# Patient Record
Sex: Male | Born: 1950
Health system: Southern US, Community
[De-identification: ages and names within clinical notes are randomized; demographics above are authoritative.]

## PROBLEM LIST (undated history)

## (undated) DIAGNOSIS — C801 Malignant (primary) neoplasm, unspecified: Secondary | ICD-10-CM

## (undated) DIAGNOSIS — N189 Chronic kidney disease, unspecified: Secondary | ICD-10-CM

## (undated) DIAGNOSIS — T7840XA Allergy, unspecified, initial encounter: Secondary | ICD-10-CM

## (undated) DIAGNOSIS — E785 Hyperlipidemia, unspecified: Secondary | ICD-10-CM

## (undated) DIAGNOSIS — E119 Type 2 diabetes mellitus without complications: Secondary | ICD-10-CM

## (undated) DIAGNOSIS — H269 Unspecified cataract: Secondary | ICD-10-CM

## (undated) DIAGNOSIS — I10 Essential (primary) hypertension: Secondary | ICD-10-CM

## (undated) DIAGNOSIS — K219 Gastro-esophageal reflux disease without esophagitis: Secondary | ICD-10-CM

## (undated) HISTORY — PX: ANAL FISSURE REPAIR: SHX2312

## (undated) HISTORY — PX: POLYPECTOMY: SHX149

## (undated) HISTORY — DX: Type 2 diabetes mellitus without complications: E11.9

## (undated) HISTORY — PX: COLONOSCOPY: SHX174

## (undated) HISTORY — DX: Allergy, unspecified, initial encounter: T78.40XA

## (undated) HISTORY — PX: CYSTOSCOPY WITH INSERTION OF UROLIFT: SHX6678

## (undated) HISTORY — DX: Gastro-esophageal reflux disease without esophagitis: K21.9

## (undated) HISTORY — DX: Chronic kidney disease, unspecified: N18.9

## (undated) HISTORY — DX: Unspecified cataract: H26.9

---

## 2005-07-05 ENCOUNTER — Ambulatory Visit: Payer: Self-pay | Admitting: Internal Medicine

## 2005-07-31 ENCOUNTER — Ambulatory Visit: Payer: Self-pay | Admitting: Internal Medicine

## 2005-08-28 ENCOUNTER — Ambulatory Visit: Payer: Self-pay | Admitting: Internal Medicine

## 2008-09-24 DIAGNOSIS — C801 Malignant (primary) neoplasm, unspecified: Secondary | ICD-10-CM

## 2008-09-24 HISTORY — DX: Malignant (primary) neoplasm, unspecified: C80.1

## 2008-09-24 HISTORY — PX: NEPHRECTOMY: SHX65

## 2009-01-22 DIAGNOSIS — C642 Malignant neoplasm of left kidney, except renal pelvis: Secondary | ICD-10-CM | POA: Insufficient documentation

## 2009-02-09 ENCOUNTER — Inpatient Hospital Stay (HOSPITAL_COMMUNITY): Admission: RE | Admit: 2009-02-09 | Discharge: 2009-02-12 | Payer: Self-pay | Admitting: Urology

## 2009-02-09 ENCOUNTER — Encounter: Payer: Self-pay | Admitting: Urology

## 2009-04-27 ENCOUNTER — Ambulatory Visit (HOSPITAL_COMMUNITY): Admission: RE | Admit: 2009-04-27 | Discharge: 2009-04-27 | Payer: Self-pay | Admitting: Urology

## 2009-08-04 ENCOUNTER — Ambulatory Visit (HOSPITAL_COMMUNITY): Admission: RE | Admit: 2009-08-04 | Discharge: 2009-08-04 | Payer: Self-pay | Admitting: Urology

## 2009-11-04 ENCOUNTER — Ambulatory Visit (HOSPITAL_COMMUNITY): Admission: RE | Admit: 2009-11-04 | Discharge: 2009-11-04 | Payer: Self-pay | Admitting: Urology

## 2010-02-06 ENCOUNTER — Ambulatory Visit (HOSPITAL_COMMUNITY): Admission: RE | Admit: 2010-02-06 | Discharge: 2010-02-06 | Payer: Self-pay | Admitting: Urology

## 2010-06-13 ENCOUNTER — Ambulatory Visit (HOSPITAL_COMMUNITY): Admission: RE | Admit: 2010-06-13 | Discharge: 2010-06-13 | Payer: Self-pay | Admitting: Urology

## 2010-09-24 HISTORY — PX: CATARACT EXTRACTION: SUR2

## 2010-10-18 ENCOUNTER — Ambulatory Visit (HOSPITAL_COMMUNITY)
Admission: RE | Admit: 2010-10-18 | Discharge: 2010-10-18 | Payer: Self-pay | Source: Home / Self Care | Attending: Urology | Admitting: Urology

## 2011-01-02 LAB — TYPE AND SCREEN
ABO/RH(D): A NEG
Antibody Screen: NEGATIVE

## 2011-01-02 LAB — CULTURE, BLOOD (ROUTINE X 2)

## 2011-01-02 LAB — BASIC METABOLIC PANEL
BUN: 13 mg/dL (ref 6–23)
CO2: 27 mEq/L (ref 19–32)
Calcium: 8.1 mg/dL — ABNORMAL LOW (ref 8.4–10.5)
Calcium: 8.6 mg/dL (ref 8.4–10.5)
Creatinine, Ser: 1.54 mg/dL — ABNORMAL HIGH (ref 0.4–1.5)
GFR calc Af Amer: 57 mL/min — ABNORMAL LOW (ref >60)
GFR calc Af Amer: >60 mL/min (ref >60)
GFR calc non Af Amer: 47 mL/min — ABNORMAL LOW (ref >60)
GFR calc non Af Amer: 52 mL/min — ABNORMAL LOW (ref >60)
GFR calc non Af Amer: 51 mL/min — ABNORMAL LOW (ref >60)
Glucose, Bld: 129 mg/dL — ABNORMAL HIGH (ref 70–99)
Glucose, Bld: 188 mg/dL — ABNORMAL HIGH (ref 70–99)
Sodium: 140 mEq/L (ref 135–145)

## 2011-01-02 LAB — CBC
HCT: 37.6 % — ABNORMAL LOW (ref 39.0–52.0)
HCT: 41.9 % (ref 39.0–52.0)
Hemoglobin: 14.8 g/dL (ref 13.0–17.0)
MCV: 93.7 fL (ref 78.0–100.0)
RBC: 4.53 MIL/uL (ref 4.22–5.81)
RDW: 13.7 % (ref 11.5–15.5)
RDW: 13.4 % (ref 11.5–15.5)
WBC: 9 10*3/uL (ref 4.0–10.5)

## 2011-01-02 LAB — ABO/RH: ABO/RH(D): A NEG

## 2011-01-02 LAB — HEMOGLOBIN AND HEMATOCRIT, BLOOD
HCT: 36.8 % — ABNORMAL LOW (ref 39.0–52.0)
Hemoglobin: 12.3 g/dL — ABNORMAL LOW (ref 13.0–17.0)

## 2011-02-06 NOTE — Op Note (Signed)
NAMEROHAAN, DURNIL                ACCOUNT NO.:  [id number]   MEDICAL RECORD NO.:  [id number]          PATIENT TYPE:  INP   LOCATION:  0005                         FACILITY:  [facility]   PHYSICIAN:  Bertram Millard. Dahlstedt, M.D.DATE OF BIRTH:  1951-02-03   DATE OF PROCEDURE:  02/09/2009  DATE OF DISCHARGE:                               OPERATIVE REPORT   PREOPERATIVE DIAGNOSIS:  Large left renal mass.   POSTOPERATIVE DIAGNOSIS:  Large left renal mass.   PRINCIPAL PROCEDURE:  Laparoscopic-assisted left radical nephrectomy.   SURGEON:  Bertram Millard. Dahlstedt, M.D.   FIRST ASSISTANT:  Delia Chimes, NP   ANESTHESIA:  General endotracheal.   COMPLICATIONS:  None.   SPECIMEN:  Left kidney.   ESTIMATED BLOOD LOSS:  150 mL.   BRIEF HISTORY:  This gentleman presented to my office with gross  hematuria a few weeks ago.  Evaluation included a CT scan.  He also had  a cystoscopy which was negative.  The CT revealed a large left renal  mass, 11-12 cm in size.  This was mid to lower pole in nature.  He had  some flank pain with this as well.  The gross hematuria has cleared.  A  metastatic survey was negative, although he had 2-3 small probable  granulomas in the lower lung.  As the patient has probable organ-  confined disease, despite a large tumor, it was recommended that he  undergo radical nephrectomy.  Open and less invasive laparoscopic  nephrectomy were discussed with the patient.  Due to the mid-to-lower  renal mass, I felt that this could be attempted laparoscopically.  He is  aware of the risks and complications of this procedure.  He desires to  proceed.   DESCRIPTION OF PROCEDURE:  The patient was identified in the holding  area, and the surgical side marked.  He received preoperative IV  antibiotics.  He was taken to the operating room where a general  anesthetic was administered.  He was placed in the left side up  position, and the table was flexed.  All extremities were  padded  appropriately, an axillary roll was placed.  His bladder was  decompressed with a Foley catheter.  His abdomen and left flank were  then prepped and draped.  A 1.5-cm incision was made in the midline just  below the umbilicus and carried down to anterior and then posterior  rectus fascia using blunt and sharp dissection.  Once the posterior  rectus fascia was incised, the peritoneum was accessed.  Hassan cannula  was placed with two 2-0 Vicryl sutures for holding sutures.  Pneumoperitoneum was then established.  Inspection of the abdomen  revealed no significant adhesions in the left abdomen.  Two other  trocars were placed, a 5-mm trocar placed just to the left of the upper  midline approximately 4 cm below the xiphisternum.  A 12-mm trocar was  placed in the left lower abdomen, both of these trocars were placed  under direct vision, after 0.5% Marcaine was used for local anesthetic.  The left colon was pushed anteriorly by this large retroperitoneal mass.  The left colon was mobilized with the Harmonic scalpel.  Once the colon  and the splenic flexure were mobilized, the kidney was easily  identified.  Dissection was carried inferiorly, and access gained to the  psoas muscle.  With all the anterior contents retracted, dissection was  carried all the way posteriorly and laterally with blunt dissection.  The ureter was easily identified, doubly clipped and divided.  I then  used the Harmonic scalpel to divide the remaining tissue below the  kidney, lateral to the colon.  Dissection was then carried posterior to  the kidney and then medial to the kidney in a superior direction.  There  was a lower pole artery and vein identified, they were doubly clipped  medially and singly clipped laterally, and divided.  Dissection then  carried [place] up to another single renal vein and artery.  The artery was  carefully skeletonized, and then doubly clipped medially with large Hem-  o-loks and  singly laterally.  The vein was then again skeletonized, and  clipped, as I did the artery.  The artery and vein were then divided,  leaving 2 clips medially.  Dissection was then carried more posteriorly  and more laterally.  Small bleeders were carefully coagulated with the  Harmonic scalpel.  Dissection was then carefully continued medially and  superiorly.  I never did identify the adrenal, which I believe was left  in situ.  It was fairly distant from the kidney on the CT scan.  The  longest part of the dissection was between the kidney and the spleen.  The kidney was flipped from side-to-side on multiple occasions, using  the Harmonic scalpel to divide all this tissue in a hemostatic method.  Once the last bit of tissue was divided, the kidney was allowed to fall  towards the right side.  Inspection of the renal fossa revealed  hemostasis.  I did use 10 mL of FloSeal to place in what was probably  the bed underlying the adrenal and the peritoneal attachments that were  remaining on the spleen.  By deflating the pneumoperitoneum, I saw no  active bleeding.  At this point, the suture passer was used to pass a 0  Vicryl suture in the left lower quadrant trocar site.  We then replaced  the 12-mm trocar.  I then removed the [facility] cannula and placed an  EndoCatch.  Under direct vision, the kidney was then placed in the  EndoCatch bag, which was then cinched and brought through the midline  lower abdominal incision.  I then opened this incision approximately 5  cm to allow [place] to extract the bag and the kidney intact.  The wound in  the lower midline was inspected.  Small bleeders were carefully  electrocoagulated.  There were 2 or 3 bleeders within the muscle.  The  lower abdominal incision was then closed with a running #1 PDS placed in  a simple fashion.  I inspected the suture line afterwards and it was  quite intact.  The pneumoperitoneum was then reestablished.  I then  inspected the  renal bed.  No active bleeding was seen.  The intestines  were allowed to fall back within the renal bed.  The upper midline  trocar was then removed under direct vision.  No bleeding was seen.  The  pneumoperitoneum was then released.  The suture that had previously been  placed in the left trocar site was brought up and tied.  All skin edges  were then  reapproximated using staples.   The patient tolerated the procedure well.  He lost approximately 150 mL  of blood during the procedure.  He remained stable, no drains were  placed, the specimen was sent as kidney to pathology.  Sponge, needle,  and instrument counts were correct x2.  He was transported to the  recovery room in stable condition after he was awakened.      Bertram Millard. Dahlstedt, M.D.  Electronically Signed     SMD/MEDQ  D:  02/09/2009  T:  02/09/2009  Job:  [id number]   cc:   Oley Balm. Georgina Pillion, M.D.  Fax: [phone]

## 2011-02-09 NOTE — Discharge Summary (Signed)
Dylan Grant, Dylan Grant                ACCOUNT NO.:  [id number]   MEDICAL RECORD NO.:  [id number]          PATIENT TYPE:  INP   LOCATION:  1421                         [facility]ACILITY:  [facility]   PHYSICIAN:  Bertram Millard. Dahlstedt, M.D.DATE OF BIRTH:  05-17-1951   DATE OF ADMISSION:  02/09/2009  DATE OF DISCHARGE:  02/12/2009                               DISCHARGE SUMMARY   ADMISSION DIAGNOSIS:  Left renal mass.   DISCHARGE DIAGNOSIS:  A 10.5 centimeter renal cell carcinoma.   PROCEDURE:  Laparoscopic assisted left radical nephrectomy.   HISTORY/PHYSICAL:  For full details, please see admission history and  physical.  Briefly, Mr. Dylan Grant is a 60 year old gentleman who presented  to Dr. Retta Diones with gross hematuria a few weeks earlier.  Evaluation  included a CT scan.  He also had a cystoscopy which was negative.  The  CT scan did reveal a large left renal mass, 11-12 cm in size.  This was  mid-lower pole in nature.  A metastatic survey was negative, although he  had 2-3 small probable granulomas in the lower lung.  As the patient has  probable organ-confined disease despite a large tumor, it was  recommended that he undergo radical nephrectomy.  Open and less invasive  laparoscopic nephrectomy were discussed with the patient.  Due to the  mid-lower pole renal mass, it was felt that this could be attempted  laparoscopically.  After careful consideration regarding these treatment  options, he did elect to proceed with a left laparoscopic assisted  radical nephrectomy.   HOSPITAL COURSE:  On Feb 09, 2009, he was taken to the operating room  where he underwent the above named procedure in which he tolerated well  and without complications.  Postoperatively, he was able to be  transferred to a regular room where he was monitored overnight and  remained hemodynamically stable.  His postoperative hemoglobin was 12.6.  On the morning of postoperative day number 1, his hemoglobin remained  stable at  12.3 and again on postoperative day number 2 remained stable  at 13.0.  His serum creatinine did increase on postoperative day number  1 from 1.40 to 1.54.  He was able to begin a clear liquid diet on  postoperative day number 1 which he tolerated without difficulty.  Therefore by the afternoon of postoperative day number 1, he was started  on a regular diet.  He did have some nausea and vomiting.  Therefore,  his diet was changed back to clear liquids with no further episodes.  He  again was restarted on a regular diet on postoperative number 2 which he  tolerated with no further nausea or vomiting.  On postoperative days 1  and 2, his temperature ranged from 99.2-102.4.  Blood cultures were  drawn and negative for any infection.  He was encouraged to use his  incentive spirometer.  He did begin ambulating after bedrest for 24-48  hours which he did without difficulty.  He was also noted to be afebrile  on postoperative day number 3 prior to discharge home.  He maintained  excellent urine output.  Therefore, a  Foley catheter was removed on  postoperative day number 2.  On postoperative day number 3, he was  tolerating his diet, ambulating without difficulty and his pain was well  controlled with oral pain medications.  He was therefore able to be  discharged home in excellent condition.   DISPOSITION:  Home.   DISCHARGE MEDICATIONS:  1. He was provided a prescription for Percocet to use as needed for      pain.  2. Told to use Colace twice daily as a stool softener.  3. He was also instructed to resume his home medications including      losartan, allopurinol, Crestor, Tricor, Fluticasone, and his      Astelin nasal spray.  4. He was advised to restart his aspirin in 7 days.   DISCHARGE INSTRUCTIONS:  He was instructed to be ambulatory, but  specifically told to refrain from any heavy lifting, strenuous activity  or driving.  He was instructed to resume a regular diet.   FOLLOW  UP:  He will return in 10-14 days for further postoperative  evaluation.   PATHOLOGY:  His pathology returned as a 10.5 cm renal cell carcinoma,  Fuhrman nuclear grade 2.      Delia Chimes, NP      Bertram Millard. Dahlstedt, M.D.  Electronically Signed    MA/MEDQ  D:  04/12/2009  T:  04/12/2009  Job:  [id number]

## 2011-08-14 ENCOUNTER — Ambulatory Visit (HOSPITAL_COMMUNITY)
Admission: RE | Admit: 2011-08-14 | Discharge: 2011-08-14 | Disposition: A | Discharge status: Home / Self Care | Payer: PRIVATE HEALTH INSURANCE | Source: Ambulatory Visit | Attending: Urology | Admitting: Urology

## 2011-08-14 ENCOUNTER — Other Ambulatory Visit: Payer: Self-pay | Admitting: Urology

## 2011-08-14 DIAGNOSIS — C649 Malignant neoplasm of unspecified kidney, except renal pelvis: Secondary | ICD-10-CM

## 2012-02-27 ENCOUNTER — Ambulatory Visit (HOSPITAL_COMMUNITY)
Admission: RE | Admit: 2012-02-27 | Discharge: 2012-02-27 | Disposition: A | Discharge status: Home / Self Care | Payer: BC Managed Care – PPO | Source: Ambulatory Visit | Attending: Urology | Admitting: Urology

## 2012-02-27 ENCOUNTER — Other Ambulatory Visit: Payer: Self-pay | Admitting: Urology

## 2012-02-27 DIAGNOSIS — C649 Malignant neoplasm of unspecified kidney, except renal pelvis: Secondary | ICD-10-CM

## 2012-09-09 ENCOUNTER — Other Ambulatory Visit: Payer: Self-pay | Admitting: Dermatology

## 2013-03-09 ENCOUNTER — Other Ambulatory Visit: Payer: Self-pay | Admitting: Urology

## 2013-03-09 ENCOUNTER — Ambulatory Visit (HOSPITAL_COMMUNITY)
Admission: RE | Admit: 2013-03-09 | Discharge: 2013-03-09 | Disposition: A | Discharge status: Home / Self Care | Payer: PRIVATE HEALTH INSURANCE | Source: Ambulatory Visit | Attending: Urology | Admitting: Urology

## 2013-03-09 DIAGNOSIS — C649 Malignant neoplasm of unspecified kidney, except renal pelvis: Secondary | ICD-10-CM

## 2013-03-09 DIAGNOSIS — M5380 Other specified dorsopathies, site unspecified: Secondary | ICD-10-CM | POA: Insufficient documentation

## 2014-01-10 ENCOUNTER — Emergency Department (HOSPITAL_COMMUNITY): Payer: 59

## 2014-01-10 ENCOUNTER — Emergency Department (HOSPITAL_COMMUNITY)
Admission: EM | Admit: 2014-01-10 | Discharge: 2014-01-10 | Disposition: A | Discharge status: Home / Self Care | Payer: 59 | Attending: Emergency Medicine | Admitting: Emergency Medicine

## 2014-01-10 ENCOUNTER — Encounter (HOSPITAL_COMMUNITY): Payer: Self-pay | Admitting: Emergency Medicine

## 2014-01-10 DIAGNOSIS — R0789 Other chest pain: Secondary | ICD-10-CM | POA: Diagnosis present

## 2014-01-10 DIAGNOSIS — Z7982 Long term (current) use of aspirin: Secondary | ICD-10-CM | POA: Insufficient documentation

## 2014-01-10 DIAGNOSIS — Z905 Acquired absence of kidney: Secondary | ICD-10-CM | POA: Insufficient documentation

## 2014-01-10 DIAGNOSIS — I10 Essential (primary) hypertension: Secondary | ICD-10-CM | POA: Insufficient documentation

## 2014-01-10 DIAGNOSIS — R079 Chest pain, unspecified: Secondary | ICD-10-CM | POA: Diagnosis present

## 2014-01-10 DIAGNOSIS — E785 Hyperlipidemia, unspecified: Secondary | ICD-10-CM | POA: Insufficient documentation

## 2014-01-10 DIAGNOSIS — Z85528 Personal history of other malignant neoplasm of kidney: Secondary | ICD-10-CM | POA: Insufficient documentation

## 2014-01-10 DIAGNOSIS — Z79899 Other long term (current) drug therapy: Secondary | ICD-10-CM | POA: Insufficient documentation

## 2014-01-10 HISTORY — DX: Hyperlipidemia, unspecified: E78.5

## 2014-01-10 HISTORY — DX: Malignant (primary) neoplasm, unspecified: C80.1

## 2014-01-10 HISTORY — DX: Essential (primary) hypertension: I10

## 2014-01-10 LAB — CBC
HCT: 39.7 % (ref 39.0–52.0)
Hemoglobin: 14.4 g/dL (ref 13.0–17.0)
MCH: 32.2 pg (ref 26.0–34.0)
MCHC: 36.3 g/dL — ABNORMAL HIGH (ref 30.0–36.0)
MCV: 88.8 fL (ref 78.0–100.0)
Platelets: 173 10*3/uL (ref 150–400)
RBC: 4.47 MIL/uL (ref 4.22–5.81)
RDW: 12.7 % (ref 11.5–15.5)
WBC: 7.1 10*3/uL (ref 4.0–10.5)

## 2014-01-10 LAB — I-STAT TROPONIN, ED: TROPONIN I, POC: 0 ng/mL (ref 0.00–0.08)

## 2014-01-10 LAB — BASIC METABOLIC PANEL
BUN: 19 mg/dL (ref 6–23)
CHLORIDE: 101 meq/L (ref 96–112)
CO2: 25 mEq/L (ref 19–32)
Calcium: 9.4 mg/dL (ref 8.4–10.5)
Creatinine, Ser: 1.52 mg/dL — ABNORMAL HIGH (ref 0.50–1.35)
GFR calc non Af Amer: 48 mL/min — ABNORMAL LOW (ref >90)
GFR, EST AFRICAN AMERICAN: 55 mL/min — AB (ref >90)
Glucose, Bld: 170 mg/dL — ABNORMAL HIGH (ref 70–99)
POTASSIUM: 4 meq/L (ref 3.7–5.3)
Sodium: 138 mEq/L (ref 137–147)

## 2014-01-10 LAB — TROPONIN I: Troponin I: <0.3 ng/mL (ref <0.30)

## 2014-01-10 NOTE — ED Notes (Signed)
Dr. Jeneen Rinks in to see pt, per Dr. Jeneen Rinks pt ok to be d/c'd at this time.

## 2014-01-10 NOTE — ED Provider Notes (Addendum)
CSN: [id number]     Arrival date & time 01/10/14  1317 History   First MD Initiated Contact with Patient 01/10/14 1330     Chief Complaint  Patient presents with  . Chest Pain     (Consider location/radiation/quality/duration/timing/severity/associated sxs/prior Treatment) Patient is a 63 y.o. male presenting with chest pain. The history is provided by the patient.  Chest Pain Pain location:  L chest Pain quality: sharp   Pain radiates to:  Does not radiate Pain radiates to the back: no   Pain severity:  Mild Onset quality:  Sudden Duration: seconds. Timing:  Intermittent Progression:  Unchanged Chronicity:  New Context: at rest   Relieved by:  Nothing Worsened by:  Nothing tried Ineffective treatments:  None tried Associated symptoms: no abdominal pain, no cough, no fever, no headache, no nausea, no numbness, no shortness of breath and not vomiting     Past Medical History  Diagnosis Date  . Hypertension   . Cancer     kidney  . Hyperlipidemia    Past Surgical History  Procedure Laterality Date  . Nephrectomy     History reviewed. No pertinent family history. History  Substance Use Topics  . Smoking status: Never Smoker   . Smokeless tobacco: Not on file  . Alcohol Use: No    Review of Systems  Constitutional: Negative for fever.  HENT: Negative for drooling and rhinorrhea.   Eyes: Negative for pain.  Respiratory: Negative for cough and shortness of breath.   Cardiovascular: Positive for chest pain. Negative for leg swelling.  Gastrointestinal: Negative for nausea, vomiting, abdominal pain and diarrhea.  Genitourinary: Negative for dysuria and hematuria.  Musculoskeletal: Negative for gait problem and neck pain.  Skin: Negative for color change.  Neurological: Negative for numbness and headaches.  Hematological: Negative for adenopathy.  Psychiatric/Behavioral: Negative for behavioral problems.  All other systems reviewed and are  negative.     Allergies  Review of patient's allergies indicates no known allergies.  Home Medications   Prior to Admission medications   Medication Sig Start Date End Date Taking? Authorizing Provider  aspirin EC 81 MG tablet Take 81 mg by mouth daily.   Yes Historical Provider, MD  atorvastatin (LIPITOR) 20 MG tablet Take 20 mg by mouth daily.   Yes Historical Provider, MD  cetirizine (ZYRTEC) 10 MG tablet Take 10 mg by mouth daily.   Yes Historical Provider, MD  Cholecalciferol (VITAMIN D) 2000 UNITS tablet Take 2,000 Units by mouth daily.   Yes Historical Provider, MD  Coenzyme Q10 50 MG CAPS Take 1 capsule by mouth daily.   Yes Historical Provider, MD  docusate sodium (COLACE) 100 MG capsule Take 100 mg by mouth daily.   Yes Historical Provider, MD  fenofibrate 160 MG tablet Take 160 mg by mouth daily.   Yes Historical Provider, MD  losartan-hydrochlorothiazide (HYZAAR) 100-12.5 MG per tablet Take 1 tablet by mouth daily.   Yes Historical Provider, MD  omega-3 acid ethyl esters (LOVAZA) 1 G capsule Take 3 g by mouth daily.   Yes Historical Provider, MD   BP 142/86  Pulse 54  Temp(Src) 98.3 F (36.8 C) (Oral)  Resp 14  SpO2 100% Physical Exam  Nursing note and vitals reviewed. Constitutional: He is oriented to person, place, and time. He appears well-developed and well-nourished.  HENT:  Head: Normocephalic and atraumatic.  Right Ear: External ear normal.  Left Ear: External ear normal.  Nose: Nose normal.  Mouth/Throat: Oropharynx is clear and moist. No  oropharyngeal exudate.  Eyes: Conjunctivae and EOM are normal. Pupils are equal, round, and reactive to light.  Neck: Normal range of motion. Neck supple.  Cardiovascular: Normal rate, regular rhythm, normal heart sounds and intact distal pulses.  Exam reveals no gallop and no friction rub.   No murmur heard. Pulmonary/Chest: Effort normal and breath sounds normal. No respiratory distress. He has no wheezes. He exhibits  no tenderness.  Abdominal: Soft. Bowel sounds are normal. He exhibits no distension. There is no tenderness. There is no rebound and no guarding.  Musculoskeletal: Normal range of motion. He exhibits no edema and no tenderness.  Neurological: He is alert and oriented to person, place, and time.  Skin: Skin is warm and dry.  Psychiatric: He has a normal mood and affect. His behavior is normal.    ED Course  Procedures (including critical care time) Labs Review Labs Reviewed  CBC - Abnormal; Notable for the following:    MCHC 36.3 (*)    All other components within normal limits  BASIC METABOLIC PANEL - Abnormal; Notable for the following:    Glucose, Bld 170 (*)    Creatinine, Ser 1.52 (*)    GFR calc non Af Amer 48 (*)    GFR calc Af Amer 55 (*)    All other components within normal limits  Randolm Idol, ED    Imaging Review Dg Chest 2 View  01/10/2014   CLINICAL DATA:  Chest pain.  Hypertension.  EXAM: CHEST  2 VIEW  COMPARISON:  03/09/2013  FINDINGS: The heart size and mediastinal contours are within normal limits. Both lungs are clear. No evidence of pleural effusion. No mass or lymphadenopathy identified.  IMPRESSION: No active cardiopulmonary disease.   Electronically Signed   By: Earle Gell M.D.   On: 01/10/2014 14:44     EKG Interpretation   Date/Time:  Sunday January 10 2014 13:23:54 EDT Ventricular Rate:  51 PR Interval:  128 QRS Duration: 100 QT Interval:  410 QTC Calculation: 378 R Axis:   56 Text Interpretation:  Sinus rhythm Low voltage, precordial leads  Anteroseptal infarct, old No significant change since last tracing  Confirmed by Sejal Cofield  MD, Demarie Hyneman ([id number]) on 01/10/2014 1:29:05 PM      MDM   Final diagnoses:  Chest pain    2:02 PM 63 y.o. male with a history of hypertension, hyperlipidemia, renal cancer status post nephrectomy who presents with left-sided chest pain. He states that around 8:30 AM this morning while at rest he began having sharp  fleeting chest pains in his left upper chest. He notes that the only last seconds up to 30 seconds at a time. He has had several episodes since that time. He is currently asymptomatic and denies any chest pain or shortness of breath. He is afebrile and vital signs are unremarkable here. His EKG is noncontributory. Will get screening labwork and imaging. He took two 325mg  asa pta. Cardiac RF's include FH, HTN, HLP. Doubt PE. Atypical for cardiac. Will plan on delta trop and d/c home w/ close f/u. Family happy w/ this plan. Strong return precautions provided.     Blanchard Kelch, MD 01/11/14 1021

## 2014-01-10 NOTE — ED Notes (Signed)
Initial Contact - pt from triage to [facility] with family, changed to hospital gown.  Pt reports episode L sided CP "in a line", sharp, 5/10, lasting approx 30 seconds onset this AM while at rest, approx 0800.  Pt reports subsequent episodes similarly, self resolving.  Pt denies pain at this time.  Pt denies radiation, not reproducible.  Pt denies associated complaints.  A+Ox4, Skin PWD. MAEI.  Speaking full/clear sentences, rr even/un-lab.  SB on monitor, no ectopy noted at this time.  NAD.

## 2014-01-10 NOTE — ED Notes (Signed)
During D/C process, pt sts "i thought he was going to recheck my blood".  Per Dr. Aline Brochure, d/c a mistake at this time, pt to have trop re-drawn in approx 1hour, then dispo.  Pt aware of plan.

## 2014-01-10 NOTE — ED Provider Notes (Signed)
Patient remains asymptomatic. Delta troponin normal. Patient discharged with aftercare instructions per Dr. Aline Brochure. For recheck here if any evolving worsening or changing symptoms. Otherwise primary care followup.  Tanna Furry, MD 01/10/14 [phone]

## 2014-01-10 NOTE — Discharge Instructions (Signed)
Chest Pain (Nonspecific) °It is often hard to give a specific diagnosis for the cause of chest pain. There is always a chance that your pain could be related to something serious, such as a heart attack or a blood clot in the lungs. You need to follow up with your caregiver for further evaluation. °CAUSES  °· Heartburn. °· Pneumonia or bronchitis. °· Anxiety or stress. °· Inflammation around your heart (pericarditis) or lung (pleuritis or pleurisy). °· A blood clot in the lung. °· A collapsed lung (pneumothorax). It can develop suddenly on its own (spontaneous pneumothorax) or from injury (trauma) to the chest. °· Shingles infection (herpes zoster virus). °The chest wall is composed of bones, muscles, and cartilage. Any of these can be the source of the pain. °· The bones can be bruised by injury. °· The muscles or cartilage can be strained by coughing or overwork. °· The cartilage can be affected by inflammation and become sore (costochondritis). °DIAGNOSIS  °Lab tests or other studies, such as X-rays, electrocardiography, stress testing, or cardiac imaging, may be needed to find the cause of your pain.  °TREATMENT  °· Treatment depends on what may be causing your chest pain. Treatment may include: °· Acid blockers for heartburn. °· Anti-inflammatory medicine. °· Pain medicine for inflammatory conditions. °· Antibiotics if an infection is present. °· You may be advised to change lifestyle habits. This includes stopping smoking and avoiding alcohol, caffeine, and chocolate. °· You may be advised to keep your head raised (elevated) when sleeping. This reduces the chance of acid going backward from your stomach into your esophagus. °· Most of the time, nonspecific chest pain will improve within 2 to 3 days with rest and mild pain medicine. °HOME CARE INSTRUCTIONS  °· If antibiotics were prescribed, take your antibiotics as directed. Finish them even if you start to feel better. °· For the next few days, avoid physical  activities that bring on chest pain. Continue physical activities as directed. °· Do not smoke. °· Avoid drinking alcohol. °· Only take over-the-counter or prescription medicine for pain, discomfort, or fever as directed by your caregiver. °· Follow your caregiver's suggestions for further testing if your chest pain does not go away. °· Keep any follow-up appointments you made. If you do not go to an appointment, you could develop lasting (chronic) problems with pain. If there is any problem keeping an appointment, you must call to reschedule. °SEEK MEDICAL CARE IF:  °· You think you are having problems from the medicine you are taking. Read your medicine instructions carefully. °· Your chest pain does not go away, even after treatment. °· You develop a rash with blisters on your chest. °SEEK IMMEDIATE MEDICAL CARE IF:  °· You have increased chest pain or pain that spreads to your arm, neck, jaw, back, or abdomen. °· You develop shortness of breath, an increasing cough, or you are coughing up blood. °· You have severe back or abdominal pain, feel nauseous, or vomit. °· You develop severe weakness, fainting, or chills. °· You have a fever. °THIS IS AN EMERGENCY. Do not wait to see if the pain will go away. Get medical help at once. Call your local emergency services (911 in [place] Do not drive yourself to the hospital. °MAKE SURE YOU:  °· Understand these instructions. °· Will watch your condition. °· Will get help right away if you are not doing well or get worse. °Document Released: 06/20/2005 Document Revised: 12/03/2011 Document Reviewed: 04/15/2008 °[employer]® Patient Information ©2014 [employer]   [employer]. ° °

## 2014-01-10 NOTE — ED Notes (Signed)
Pt states that he started having intermittent, sharp, central CP since this morning.  Denies cardiac hx.  Pt is not having pain now.

## 2014-03-04 ENCOUNTER — Ambulatory Visit (HOSPITAL_COMMUNITY)
Admission: RE | Admit: 2014-03-04 | Discharge: 2014-03-04 | Disposition: A | Discharge status: Home / Self Care | Payer: Managed Care, Other (non HMO) | Source: Ambulatory Visit | Attending: Urology | Admitting: Urology

## 2014-03-04 ENCOUNTER — Other Ambulatory Visit: Payer: Self-pay | Admitting: Urology

## 2014-03-04 DIAGNOSIS — C649 Malignant neoplasm of unspecified kidney, except renal pelvis: Secondary | ICD-10-CM | POA: Insufficient documentation

## 2015-02-15 ENCOUNTER — Encounter: Payer: Self-pay | Admitting: Podiatry

## 2015-02-15 ENCOUNTER — Ambulatory Visit (INDEPENDENT_AMBULATORY_CARE_PROVIDER_SITE_OTHER): Payer: 59 | Admitting: Podiatry

## 2015-02-15 VITALS — BP 158/86 | HR 58 | Resp 16

## 2015-02-15 DIAGNOSIS — B070 Plantar wart: Secondary | ICD-10-CM | POA: Diagnosis not present

## 2015-02-15 DIAGNOSIS — Q828 Other specified congenital malformations of skin: Secondary | ICD-10-CM

## 2015-02-15 MED ORDER — FLUOROURACIL 5 % EX CREA: TOPICAL_CREAM | Freq: Two times a day (BID) | CUTANEOUS | Status: DC

## 2015-02-15 NOTE — Progress Notes (Signed)
He presents today for follow-up of painful lesions plantar aspect of the right foot. He states that was between the fourth and fifth digits of the plantar right foot are the most painful as well as one sub-fifth metatarsal head of the right foot. He states they occasionally feel often feel better. He has tried over-the-counter treatments to know about.  Objective: Signs are stable he is alert and oriented 3 pulses are palpable bilateral. Pulses are strong. Cutaneous evaluationsolitary for keratoma sub-fifth right. 2 small verrucoid lesions at the base of the fourth and fifth toes. They measure less than a centimeter in diameter.  Assessment: Verruca plantaris right foot 2. Solitary for keratoma 1.  Plan: Chemical destruction of all 3 lesions today application of Efudex cream twice daily once blistering has occurred to the verrucoid lesions. Follow up with him in 6 weeks.

## 2015-03-14 ENCOUNTER — Ambulatory Visit (HOSPITAL_COMMUNITY)
Admission: RE | Admit: 2015-03-14 | Discharge: 2015-03-14 | Disposition: A | Discharge status: Home / Self Care | Payer: 59 | Source: Ambulatory Visit | Attending: Urology | Admitting: Urology

## 2015-03-14 ENCOUNTER — Other Ambulatory Visit: Payer: Self-pay | Admitting: Urology

## 2015-03-14 DIAGNOSIS — C649 Malignant neoplasm of unspecified kidney, except renal pelvis: Secondary | ICD-10-CM | POA: Insufficient documentation

## 2015-03-29 ENCOUNTER — Ambulatory Visit (INDEPENDENT_AMBULATORY_CARE_PROVIDER_SITE_OTHER): Payer: 59 | Admitting: Podiatry

## 2015-03-29 ENCOUNTER — Encounter: Payer: Self-pay | Admitting: Podiatry

## 2015-03-29 VITALS — BP 134/83 | HR 69 | Resp 16

## 2015-03-29 DIAGNOSIS — B070 Plantar wart: Secondary | ICD-10-CM | POA: Diagnosis not present

## 2015-03-30 NOTE — Progress Notes (Signed)
He presents today for follow-up of the warts to the plantar aspect of the right foot. He states they seem to be getting much better. He does not want to blister them again because he isn't out to go on vacation.  Objective: Pulses are palpable right verrucoid lesions appear to be diminished considerably in size and thickness. After debridement there is no bleeding noted.  Assessment: Well-healing verrucoid lesions distal lateral aspect right foot.  Plan: Continue the use of Efudex cream follow up with me in 6 weeks.

## 2015-05-17 ENCOUNTER — Ambulatory Visit (INDEPENDENT_AMBULATORY_CARE_PROVIDER_SITE_OTHER): Payer: 59 | Admitting: Podiatry

## 2015-05-17 ENCOUNTER — Encounter: Payer: Self-pay | Admitting: Podiatry

## 2015-05-17 VITALS — BP 144/76 | HR 59 | Resp 16

## 2015-05-17 DIAGNOSIS — B070 Plantar wart: Secondary | ICD-10-CM

## 2015-05-17 DIAGNOSIS — M722 Plantar fascial fibromatosis: Secondary | ICD-10-CM | POA: Diagnosis not present

## 2015-05-17 NOTE — Progress Notes (Signed)
He presents today for follow-up of a wart to the plantar aspect of his right foot. He also states that not only does the seem to be getting better however I also think he might have a heel issue. He states his heel pain is been going on on and off about 6 months. He denies any trauma and states it is particularly bad in the morning.  Objective: Vital signs are stable alert and oriented 3. Pulses are strongly palpable he has pain on palpation medial calcaneal tubercle on the right heel. Warts appear to be resolving.  Assessment: Resolving verruca plantaris right. Plantar fascia.  Plan: Discussed etiology pathology conservative versus surgical release. Injected the right heel today with Kenalog local anesthesia. He will continue the use of his Efudex cream. Follow-up with him in 6 weeks  Roselind Messier DPM

## 2015-06-28 ENCOUNTER — Ambulatory Visit: Payer: 59 | Admitting: Podiatry

## 2015-08-11 ENCOUNTER — Encounter: Payer: Self-pay | Admitting: Gastroenterology

## 2015-10-05 ENCOUNTER — Ambulatory Visit (AMBULATORY_SURGERY_CENTER): Payer: Self-pay

## 2015-10-05 VITALS — Ht 68.0 in | Wt 214.0 lb

## 2015-10-05 DIAGNOSIS — Z1211 Encounter for screening for malignant neoplasm of colon: Secondary | ICD-10-CM

## 2015-10-05 MED ORDER — NA SULFATE-K SULFATE-MG SULF 17.5-3.13-1.6 GM/177ML PO SOLN: 1.0000 | Freq: Once | ORAL | Status: DC

## 2015-10-05 NOTE — Progress Notes (Signed)
No egg or soy allergies Not on home 02 No previous anesthesia complications No diet or weight loss meds 

## 2015-10-19 ENCOUNTER — Ambulatory Visit (AMBULATORY_SURGERY_CENTER): Payer: Managed Care, Other (non HMO) | Admitting: Gastroenterology

## 2015-10-19 ENCOUNTER — Encounter: Payer: Self-pay | Admitting: Gastroenterology

## 2015-10-19 VITALS — BP 129/90 | HR 44 | Temp 97.4°F | Resp 16 | Ht 68.0 in | Wt 214.0 lb

## 2015-10-19 DIAGNOSIS — D123 Benign neoplasm of transverse colon: Secondary | ICD-10-CM | POA: Diagnosis not present

## 2015-10-19 DIAGNOSIS — Z1211 Encounter for screening for malignant neoplasm of colon: Secondary | ICD-10-CM

## 2015-10-19 MED ORDER — SODIUM CHLORIDE 0.9 % IV SOLN: 500.0000 mL | INTRAVENOUS | Status: DC

## 2015-10-19 NOTE — Patient Instructions (Signed)

## 2015-10-19 NOTE — Progress Notes (Signed)
Report to PACU, RN, vss, BBS= Clear.  

## 2015-10-19 NOTE — Progress Notes (Signed)
Called to room to assist during endoscopic procedure.  Patient ID and intended procedure confirmed with present staff. Received instructions for my participation in the procedure from the performing physician.  

## 2015-10-19 NOTE — Op Note (Signed)
[facility]  [address]. [place], [address]   COLONOSCOPY PROCEDURE REPORT  PATIENT: Dylan Grant, Dylan Grant  MR#: [id number] BIRTHDATE: 1950-09-25 , 64  yrs. old GENDER: male ENDOSCOPIST: Harl Bowie, MD REFERRED [id number] Inda Merlin, M.D. PROCEDURE DATE:  10/19/2015 PROCEDURE:   Colonoscopy, screening and Colonoscopy with snare polypectomy First Screening Colonoscopy - Avg.  risk and is 50 yrs.  old or older - No.  Prior Negative Screening - Now for repeat screening. 10 or more years since last screening  History of Adenoma - Now for follow-up colonoscopy & has been > or = to 3 yrs.  N/A  Polyps removed today? Yes ASA CLASS:   Class II INDICATIONS:Screening for colonic neoplasia and Colorectal Neoplasm Risk Assessment for this procedure is average risk. MEDICATIONS: Propofol 200 mg IV  DESCRIPTION OF PROCEDURE:   After the risks benefits and alternatives of the procedure were thoroughly explained, informed consent was obtained.  The digital rectal exam revealed no abnormalities of the rectum.   The LB [id number]  endoscope was introduced through the anus and advanced to the terminal ileum which was intubated for a short distance. No adverse events experienced.   The quality of the prep was good.  The instrument was then slowly withdrawn as the colon was fully examined. Estimated blood loss is zero unless otherwise noted in this procedure report.    COLON FINDINGS: A sessile polyp measuring 10 mm in size was found at the hepatic flexure.  A polypectomy was performed with a cold snare.  The resection was complete, the polyp tissue was completely retrieved and sent to histology.  Retroflexed views revealed internal hemorrhoids. The time to cecum = 1.9 Withdrawal time = 13.2   The scope was withdrawn and the procedure completed. COMPLICATIONS: There were no immediate complications.  ENDOSCOPIC IMPRESSION: Sessile polyp was found at the hepatic flexure;  polypectomy was performed with a cold snare  RECOMMENDATIONS: If the polyp(s) removed today are proven to be adenomatous (pre-cancerous) polyps, you will need a repeat colonoscopy in 5 years.  Otherwise you should continue to follow colorectal cancer screening guidelines for "routine risk" patients with colonoscopy in 10 years.  You will receive a letter within 1-2 weeks with the results of your biopsy as well as final recommendations.  Please call my office if you have not received a letter after 3 weeks.  eSigned:  Harl Bowie, MD 10/19/2015 9:58 AM

## 2015-10-20 ENCOUNTER — Telehealth: Payer: Self-pay

## 2015-10-20 NOTE — Telephone Encounter (Signed)
  Follow up Call-  Call back number 10/19/2015  Post procedure Call Back phone  # [phone]  Permission to leave phone message Yes     Patient questions:  Do you have a fever, pain , or abdominal swelling? No. Pain Score  0 *  Have you tolerated food without any problems? Yes.    Have you been able to return to your normal activities? Yes.    Do you have any questions about your discharge instructions: Diet   No. Medications  No. Follow up visit  No.  Do you have questions or concerns about your Care? No.  Actions: * If pain score is 4 or above: No action needed, pain <4.

## 2015-10-31 ENCOUNTER — Other Ambulatory Visit: Payer: Self-pay

## 2015-10-31 ENCOUNTER — Telehealth: Payer: Self-pay | Admitting: Gastroenterology

## 2015-10-31 DIAGNOSIS — R197 Diarrhea, unspecified: Secondary | ICD-10-CM

## 2015-10-31 NOTE — Telephone Encounter (Signed)
Please check c.diff

## 2015-10-31 NOTE — Telephone Encounter (Signed)
Patient agrees to this plan. °

## 2015-10-31 NOTE — Telephone Encounter (Signed)
Patient had a screening colonoscopy 10/19/15. He has continued to have watery gassy stools since then up to 5-6-7 times a day. He states he does not have urgency or abdominal pain. He has started a pro-biotic only. No other medications have been tried. What would he need to do?

## 2015-11-01 ENCOUNTER — Other Ambulatory Visit: Payer: Managed Care, Other (non HMO)

## 2015-11-01 DIAGNOSIS — R197 Diarrhea, unspecified: Secondary | ICD-10-CM

## 2015-11-02 ENCOUNTER — Telehealth: Payer: Self-pay | Admitting: Gastroenterology

## 2015-11-02 LAB — CLOSTRIDIUM DIFFICILE BY PCR: CDIFFPCR: NOT DETECTED

## 2015-11-02 NOTE — Telephone Encounter (Signed)
Patient advised the c-diff test was negative.

## 2015-11-08 ENCOUNTER — Encounter: Payer: Self-pay | Admitting: Gastroenterology

## 2016-01-24 ENCOUNTER — Ambulatory Visit: Payer: 59 | Admitting: Podiatry

## 2016-01-30 ENCOUNTER — Other Ambulatory Visit (HOSPITAL_COMMUNITY): Payer: Self-pay | Admitting: Nephrology

## 2016-01-30 DIAGNOSIS — N183 Chronic kidney disease, stage 3 unspecified: Secondary | ICD-10-CM

## 2016-01-30 DIAGNOSIS — I10 Essential (primary) hypertension: Secondary | ICD-10-CM

## 2016-02-02 ENCOUNTER — Ambulatory Visit (HOSPITAL_COMMUNITY)
Admission: RE | Admit: 2016-02-02 | Discharge: 2016-02-02 | Disposition: A | Discharge status: Home / Self Care | Payer: Managed Care, Other (non HMO) | Source: Ambulatory Visit | Attending: Cardiovascular Disease | Admitting: Cardiovascular Disease

## 2016-02-02 DIAGNOSIS — E785 Hyperlipidemia, unspecified: Secondary | ICD-10-CM | POA: Diagnosis not present

## 2016-02-02 DIAGNOSIS — E119 Type 2 diabetes mellitus without complications: Secondary | ICD-10-CM | POA: Diagnosis not present

## 2016-02-02 DIAGNOSIS — K219 Gastro-esophageal reflux disease without esophagitis: Secondary | ICD-10-CM | POA: Insufficient documentation

## 2016-02-02 DIAGNOSIS — I10 Essential (primary) hypertension: Secondary | ICD-10-CM

## 2016-02-02 DIAGNOSIS — N183 Chronic kidney disease, stage 3 unspecified: Secondary | ICD-10-CM

## 2016-02-02 DIAGNOSIS — I129 Hypertensive chronic kidney disease with stage 1 through stage 4 chronic kidney disease, or unspecified chronic kidney disease: Secondary | ICD-10-CM | POA: Insufficient documentation

## 2016-05-21 ENCOUNTER — Other Ambulatory Visit: Payer: Self-pay | Admitting: Urology

## 2016-05-21 ENCOUNTER — Ambulatory Visit (HOSPITAL_COMMUNITY)
Admission: RE | Admit: 2016-05-21 | Discharge: 2016-05-21 | Disposition: A | Discharge status: Home / Self Care | Payer: Managed Care, Other (non HMO) | Source: Ambulatory Visit | Attending: Urology | Admitting: Urology

## 2016-05-21 DIAGNOSIS — C642 Malignant neoplasm of left kidney, except renal pelvis: Secondary | ICD-10-CM

## 2017-06-19 ENCOUNTER — Ambulatory Visit (HOSPITAL_COMMUNITY)
Admission: RE | Admit: 2017-06-19 | Discharge: 2017-06-19 | Disposition: A | Discharge status: Home / Self Care | Payer: Managed Care, Other (non HMO) | Source: Ambulatory Visit | Attending: Urology | Admitting: Urology

## 2017-06-19 ENCOUNTER — Other Ambulatory Visit: Payer: Self-pay | Admitting: Urology

## 2017-06-19 DIAGNOSIS — Z85528 Personal history of other malignant neoplasm of kidney: Secondary | ICD-10-CM | POA: Insufficient documentation

## 2017-11-06 ENCOUNTER — Encounter (HOSPITAL_COMMUNITY): Payer: Self-pay | Admitting: Emergency Medicine

## 2017-11-06 ENCOUNTER — Emergency Department (HOSPITAL_COMMUNITY): Payer: Managed Care, Other (non HMO)

## 2017-11-06 ENCOUNTER — Other Ambulatory Visit: Payer: Self-pay

## 2017-11-06 DIAGNOSIS — Z85528 Personal history of other malignant neoplasm of kidney: Secondary | ICD-10-CM | POA: Diagnosis not present

## 2017-11-06 DIAGNOSIS — J111 Influenza due to unidentified influenza virus with other respiratory manifestations: Secondary | ICD-10-CM | POA: Insufficient documentation

## 2017-11-06 DIAGNOSIS — B349 Viral infection, unspecified: Secondary | ICD-10-CM | POA: Insufficient documentation

## 2017-11-06 DIAGNOSIS — N189 Chronic kidney disease, unspecified: Secondary | ICD-10-CM | POA: Diagnosis not present

## 2017-11-06 DIAGNOSIS — I129 Hypertensive chronic kidney disease with stage 1 through stage 4 chronic kidney disease, or unspecified chronic kidney disease: Secondary | ICD-10-CM | POA: Diagnosis not present

## 2017-11-06 DIAGNOSIS — E860 Dehydration: Secondary | ICD-10-CM | POA: Insufficient documentation

## 2017-11-06 DIAGNOSIS — E1122 Type 2 diabetes mellitus with diabetic chronic kidney disease: Secondary | ICD-10-CM | POA: Insufficient documentation

## 2017-11-06 DIAGNOSIS — Z7984 Long term (current) use of oral hypoglycemic drugs: Secondary | ICD-10-CM | POA: Insufficient documentation

## 2017-11-06 DIAGNOSIS — Z79899 Other long term (current) drug therapy: Secondary | ICD-10-CM | POA: Insufficient documentation

## 2017-11-06 DIAGNOSIS — Z7982 Long term (current) use of aspirin: Secondary | ICD-10-CM | POA: Insufficient documentation

## 2017-11-06 DIAGNOSIS — R05 Cough: Secondary | ICD-10-CM | POA: Diagnosis present

## 2017-11-06 LAB — I-STAT CG4 LACTIC ACID, ED: LACTIC ACID, VENOUS: 1.59 mmol/L (ref 0.5–1.9)

## 2017-11-06 MED ORDER — ACETAMINOPHEN 325 MG PO TABS: 650.0000 mg | ORAL_TABLET | Freq: Once | ORAL | Status: DC | PRN

## 2017-11-06 NOTE — ED Triage Notes (Signed)
Patient states he has a fever, feeling weak, and generalized body aches. Per family patient was altered for a little while.

## 2017-11-06 NOTE — ED Notes (Signed)
I got a set of blood cultures on the patient and set them to the lab

## 2017-11-07 ENCOUNTER — Emergency Department (HOSPITAL_COMMUNITY)
Admission: EM | Admit: 2017-11-07 | Discharge: 2017-11-07 | Disposition: A | Discharge status: Home / Self Care | Payer: Managed Care, Other (non HMO) | Attending: Emergency Medicine | Admitting: Emergency Medicine

## 2017-11-07 DIAGNOSIS — J111 Influenza due to unidentified influenza virus with other respiratory manifestations: Secondary | ICD-10-CM

## 2017-11-07 DIAGNOSIS — R059 Cough, unspecified: Secondary | ICD-10-CM

## 2017-11-07 DIAGNOSIS — R05 Cough: Secondary | ICD-10-CM

## 2017-11-07 DIAGNOSIS — E860 Dehydration: Secondary | ICD-10-CM

## 2017-11-07 DIAGNOSIS — B349 Viral infection, unspecified: Secondary | ICD-10-CM

## 2017-11-07 LAB — COMPREHENSIVE METABOLIC PANEL
ALBUMIN: 4.2 g/dL (ref 3.5–5.0)
ALK PHOS: 45 U/L (ref 38–126)
ALT: 43 U/L (ref 17–63)
ANION GAP: 15 (ref 5–15)
AST: 34 U/L (ref 15–41)
BUN: 19 mg/dL (ref 6–20)
CALCIUM: 8.9 mg/dL (ref 8.9–10.3)
CHLORIDE: 95 mmol/L — AB (ref 101–111)
CO2: 18 mmol/L — AB (ref 22–32)
Creatinine, Ser: 1.55 mg/dL — ABNORMAL HIGH (ref 0.61–1.24)
GFR calc Af Amer: 53 mL/min — ABNORMAL LOW (ref >60)
GFR calc non Af Amer: 46 mL/min — ABNORMAL LOW (ref >60)
Glucose, Bld: 170 mg/dL — ABNORMAL HIGH (ref 65–99)
Potassium: 3.4 mmol/L — ABNORMAL LOW (ref 3.5–5.1)
SODIUM: 128 mmol/L — AB (ref 135–145)
Total Bilirubin: 1.7 mg/dL — ABNORMAL HIGH (ref 0.3–1.2)
Total Protein: 7.4 g/dL (ref 6.5–8.1)

## 2017-11-07 LAB — URINALYSIS, ROUTINE W REFLEX MICROSCOPIC
Bilirubin Urine: NEGATIVE
Glucose, UA: NEGATIVE mg/dL
Ketones, ur: NEGATIVE mg/dL
Leukocytes, UA: NEGATIVE
Nitrite: NEGATIVE
PROTEIN: NEGATIVE mg/dL
SQUAMOUS EPITHELIAL / LPF: NONE SEEN
Specific Gravity, Urine: 1.012 (ref 1.005–1.030)
pH: 5 (ref 5.0–8.0)

## 2017-11-07 LAB — CBC WITH DIFFERENTIAL/PLATELET
Basophils Absolute: 0 10*3/uL (ref 0.0–0.1)
Basophils Relative: 0 %
Eosinophils Absolute: 0 10*3/uL (ref 0.0–0.7)
Eosinophils Relative: 0 %
HEMATOCRIT: 36.7 % — AB (ref 39.0–52.0)
HEMOGLOBIN: 13.3 g/dL (ref 13.0–17.0)
LYMPHS ABS: 0.6 10*3/uL — AB (ref 0.7–4.0)
Lymphocytes Relative: 7 %
MCH: 33.3 pg (ref 26.0–34.0)
MCHC: 36.2 g/dL — ABNORMAL HIGH (ref 30.0–36.0)
MCV: 91.8 fL (ref 78.0–100.0)
MONO ABS: 0.8 10*3/uL (ref 0.1–1.0)
MONOS PCT: 10 %
NEUTROS ABS: 6.7 10*3/uL (ref 1.7–7.7)
Neutrophils Relative %: 83 %
Platelets: 144 10*3/uL — ABNORMAL LOW (ref 150–400)
RBC: 4 MIL/uL — ABNORMAL LOW (ref 4.22–5.81)
RDW: 13 % (ref 11.5–15.5)
WBC: 8.1 10*3/uL (ref 4.0–10.5)

## 2017-11-07 LAB — RAPID STREP SCREEN (MED CTR MEBANE ONLY): STREPTOCOCCUS, GROUP A SCREEN (DIRECT): NEGATIVE

## 2017-11-07 LAB — INFLUENZA PANEL BY PCR (TYPE A & B)
INFLBPCR: NEGATIVE
Influenza A By PCR: POSITIVE — AB

## 2017-11-07 MED ORDER — OSELTAMIVIR PHOSPHATE 75 MG PO CAPS: 75.0000 mg | ORAL_CAPSULE | Freq: Two times a day (BID) | ORAL | 0 refills | Status: DC

## 2017-11-07 MED ORDER — SODIUM CHLORIDE 0.9 % IV BOLUS (SEPSIS)
1000.0000 mL | Freq: Once | INTRAVENOUS | Status: AC
  Administered 2017-11-07: 1000 mL via INTRAVENOUS

## 2017-11-07 MED ORDER — OSELTAMIVIR PHOSPHATE 75 MG PO CAPS
75.0000 mg | ORAL_CAPSULE | Freq: Once | ORAL | Status: AC
  Administered 2017-11-07: 75 mg via ORAL
  Filled 2017-11-07: qty 1

## 2017-11-07 NOTE — ED Notes (Signed)
Patient has attempted but unable to give urine sample at this time.

## 2017-11-07 NOTE — ED Provider Notes (Signed)
[facility] DEPT Provider Note   CSN: [id number] Arrival date & time: 11/06/17  2059     History   Chief Complaint Chief Complaint  Patient presents with  . Generalized Body Aches  . Fever  . Weakness    HPI METRO EDENFIELD is a 67 y.o. male.  The history is provided by the patient and a relative. No language interpreter was used.  Fever   This is a new problem. The current episode started more than 2 days ago. The problem occurs constantly. The problem has not changed since onset.The maximum temperature noted was 102 to 102.9 F. The temperature was taken using an oral thermometer. Associated symptoms include congestion, muscle aches and cough. Pertinent negatives include no chest pain, no diarrhea, no vomiting and no headaches. He has tried nothing for the symptoms. The treatment provided no relief.    Past Medical History:  Diagnosis Date  . Allergy   . Cancer ([facility]) 2010   kidney  . Chronic kidney disease    kidney disease  . Diabetes mellitus without complication (HCC)    prediabetes  . GERD (gastroesophageal reflux disease)   . Hyperlipidemia   . Hypertension     Patient Active Problem List   Diagnosis Date Noted  . Atypical chest pain 01/10/2014  . Chest pain 01/10/2014    Past Surgical History:  Procedure Laterality Date  . ANAL FISSURE REPAIR    . CATARACT EXTRACTION Bilateral 2012  . NEPHRECTOMY Left 2010   kidney cancer       Home Medications    Prior to Admission medications   Medication Sig Start Date End Date Taking? Authorizing Provider  amLODipine (NORVASC) 10 MG tablet Take 10 mg by mouth daily. 10/10/17  Yes [provider]  aspirin EC 81 MG tablet Take 81 mg by mouth daily.   Yes [provider]  cetirizine (ZYRTEC) 10 MG tablet Take 10 mg by mouth daily.   Yes [provider]  Cholecalciferol (VITAMIN D) 2000 UNITS tablet Take 2,000 Units by mouth daily.   Yes [provider]  Coenzyme Q10 50 MG CAPS Take 1 capsule by mouth daily.   Yes [provider]  docusate sodium (COLACE) 100 MG capsule Take 100 mg by mouth daily.    Yes [provider]  doxazosin (CARDURA) 4 MG tablet Take 4 mg by mouth daily.   Yes [provider]  fluticasone (FLONASE) 50 MCG/ACT nasal spray Place 1 spray into both nostrils daily.    Yes [provider]  losartan-hydrochlorothiazide (HYZAAR) 100-12.5 MG per tablet Take 1 tablet by mouth daily.   Yes [provider]  METFORMIN HCL PO Take 500 mg by mouth 2 (two) times daily.    Yes [provider]  Multiple Vitamin (MULTIVITAMIN WITH MINERALS) TABS tablet Take 1 tablet by mouth daily.   Yes [provider]  omega-3 acid ethyl esters (LOVAZA) 1 G capsule Take 3 g by mouth daily.   Yes [provider]  tamsulosin (FLOMAX) 0.4 MG CAPS capsule Take 1 capsule by mouth daily. 11/04/17  Yes [provider]  fluorouracil (EFUDEX) 5 % cream Apply topically 2 (two) times daily. Patient not taking: Reported on 11/07/2017 02/15/15   Garrel Ridgel, DPM    Family History Family History  Problem Relation Age of Onset  . Colon cancer Neg Hx     Social History Social History   Tobacco Use  . Smoking status: Never Smoker  .  Smokeless tobacco: Never Used  Substance Use Topics  . Alcohol use: No    Alcohol/week: 0.0 oz  . Drug use: No     Allergies   Patient has no known allergies.   Review of Systems Review of Systems  Constitutional: Positive for chills, fatigue and fever. Negative for diaphoresis.  HENT: Positive for congestion.   Respiratory: Positive for cough. Negative for chest tightness, shortness of breath and stridor.   Cardiovascular: Negative for chest pain, palpitations and leg swelling.  Gastrointestinal: Negative for abdominal pain, constipation, diarrhea, nausea and vomiting.  Genitourinary: Positive for decreased urine volume. Negative for  dysuria and flank pain.  Musculoskeletal: Negative for back pain, neck pain and neck stiffness.  Neurological: Negative for syncope, weakness, light-headedness and headaches.  Psychiatric/Behavioral: Negative for agitation.  All other systems reviewed and are negative.    Physical Exam Updated Vital Signs BP 113/61   Pulse 64   Temp 100.3 F (37.9 C) (Oral)   Resp 18   Ht 5\' 8"  (1.727 m)   Wt 92.1 kg (203 lb)   SpO2 94%   BMI 30.87 kg/m   Physical Exam  Constitutional: He is oriented to person, place, and time. He appears well-developed and well-nourished. No distress.  HENT:  Head: Normocephalic.  Mouth/Throat: Oropharynx is clear and moist. No oropharyngeal exudate.  Eyes: Conjunctivae and EOM are normal. Pupils are equal, round, and reactive to light.  Neck: Normal range of motion.  Cardiovascular: Normal rate and intact distal pulses.  No murmur heard. Pulmonary/Chest: Effort normal and breath sounds normal. No stridor. No respiratory distress. He has no wheezes. He exhibits no tenderness.  Abdominal: Soft. Bowel sounds are normal. There is no tenderness.  Musculoskeletal: Normal range of motion. He exhibits no edema or tenderness.  Neurological: He is alert and oriented to person, place, and time. No cranial nerve deficit or sensory deficit. He exhibits normal muscle tone.  Skin: Capillary refill takes less than 2 seconds. He is not diaphoretic. No erythema. No pallor.  Psychiatric: He has a normal mood and affect.  Nursing note and vitals reviewed.    ED Treatments / Results  Labs (all labs ordered are listed, but only abnormal results are displayed) Labs Reviewed  COMPREHENSIVE METABOLIC PANEL - Abnormal; Notable for the following components:      Result Value   Sodium 128 (*)    Potassium 3.4 (*)    Chloride 95 (*)    CO2 18 (*)    Glucose, Bld 170 (*)    Creatinine, Ser 1.55 (*)    Total Bilirubin 1.7 (*)    GFR calc non Af Amer 46 (*)    GFR calc Af  Amer 53 (*)    All other components within normal limits  CBC WITH DIFFERENTIAL/PLATELET - Abnormal; Notable for the following components:   RBC 4.00 (*)    HCT 36.7 (*)    MCHC 36.2 (*)    Platelets 144 (*)    Lymphs Abs 0.6 (*)    All other components within normal limits  URINALYSIS, ROUTINE W REFLEX MICROSCOPIC - Abnormal; Notable for the following components:   Hgb urine dipstick MODERATE (*)    Bacteria, UA RARE (*)    All other components within normal limits  INFLUENZA PANEL BY PCR (TYPE A & B) - Abnormal; Notable for the following components:   Influenza A By PCR POSITIVE (*)    All other components within normal limits  RAPID STREP SCREEN (NOT AT [facility])  CULTURE, GROUP A STREP ([facility])  I-STAT CG4 LACTIC ACID, ED    EKG  EKG Interpretation None       Radiology Dg Chest 2 View  Result Date: 11/07/2017 CLINICAL DATA:  Weakness with body aches and confusion for 48 hours. EXAM: CHEST  2 VIEW COMPARISON:  06/19/2017 FINDINGS: The heart size and mediastinal contours are within normal limits. Minimal atelectasis is noted at each lung base. The visualized skeletal structures are unremarkable. IMPRESSION: No active cardiopulmonary disease. Electronically Signed   By: Ashley Royalty M.D.   On: 11/07/2017 00:14    Procedures Procedures (including critical care time)  Medications Ordered in ED Medications  acetaminophen (TYLENOL) tablet 650 mg (not administered)  sodium chloride 0.9 % bolus 1,000 mL (0 mLs Intravenous Stopped 11/07/17 0818)  sodium chloride 0.9 % bolus 1,000 mL (0 mLs Intravenous Stopped 11/07/17 0818)  oseltamivir (TAMIFLU) capsule 75 mg (75 mg Oral Given 11/07/17 0819)     Initial Impression / Assessment and Plan / ED Course  I have reviewed the triage vital signs and the nursing notes.  Pertinent labs & imaging results that were available during my care of the patient were reviewed by me and considered in my medical decision making (see chart for  details).     WINSLOW EDERER is a 67 y.o. male with a past medical history significant for renal cancer status post nephrectomy, CKD, GERD, diabetes, hypertension, and hyperlipidemia who presents with fever, decreased oral intake, rhinorrhea, congestion, cough, and myalgias.  According to patient and family, patient has been feeling bad for the last 3 days.  He was found to have a fever of 103 on arrival.  Patient reports muscle soreness diffusely and myalgias.  He reports no nausea or vomiting per reports he has had decreased urination and decreased oral intake.  He is feeling dehydrated.  Family thought he was slightly confused but that has resolved.  He has had no production of his cough just a dry cough.  He denies any significant pains on arrival.  On exam, patient is alert and oriented.  Patient follows all commands.  Patient had no focal neurologic deficits on my evaluation.  Patient's lungs were clear and chest was nontender.  Abdomen was nontender.  Patient had no significant edema in extremities and patient appears well.  Patient did however seem fatigued.  Based on patient's symptoms, I am concerned about influenza versus other infection.  Chest x-ray was ordered showing no pneumonia.  Urinalysis showed no UTI.  Lactic acid was normal and strep test was negative with his sore throat report.  Influenza test was positive.  Laboratory testing showed concern for some electrolyte abnormalities however fluids were provided.  Patient reports his creatinine is always elevated in the setting of his nephrectomy.  No significant anemia or leukocytosis.  Given the patient's reassuring workup and discovery of influenza, do not feel patient has severe bacterial infection.  Patient felt much better after fluids.  Patient will be given prescription and dose of Tamiflu and instructions to stay hydrated.  Patient will follow up with PCP for further management.  Patient understood return precautions for any new or  worsened symptoms.  Patient had no other questions or concerns and was discharged in good condition with improving symptoms.    Final Clinical Impressions(s) / ED Diagnoses   Final diagnoses:  Influenza  Viral syndrome  Cough  Dehydration    ED Discharge Orders        Ordered  oseltamivir (TAMIFLU) 75 MG capsule  Every 12 hours     11/07/17 0726      Clinical Impression: 1. Influenza   2. Viral syndrome   3. Cough   4. Dehydration     Disposition: Discharge  Condition: Good  I have discussed the results, Dx and Tx plan with the pt(& family if present). He/she/they expressed understanding and agree(s) with the plan. Discharge instructions discussed at great length. Strict return precautions discussed and pt &/or family have verbalized understanding of the instructions. No further questions at time of discharge.    New Prescriptions   OSELTAMIVIR (TAMIFLU) 75 MG CAPSULE    Take 1 capsule (75 mg total) by mouth every 12 (twelve) hours.    Follow Up: Josetta Huddle, MD [address] [address] [place] DEPT [place] [id number] mc [address] [place]       Tegeler, Gwenyth Allegra, MD 11/07/17 [phone]

## 2017-11-07 NOTE — Discharge Instructions (Signed)
Your workup today showed evidence of influenza.  I feel this is the cause of your constellation of symptoms.  We did not find evidence of pneumonia or urine other workup was reassuring.  As you felt better after fluids, we suspect you were also dehydrated.  Please use the Tamiflu to help shorten the course of your symptoms from the flu.  Please observe good handwashing hygiene and rest.  Please follow-up with your primary doctor in several days.  If any symptoms change or worsen, please return to the nearest emergency department.

## 2017-11-09 LAB — CULTURE, GROUP A STREP (THRC)

## 2018-05-10 DIAGNOSIS — R69 Illness, unspecified: Secondary | ICD-10-CM | POA: Diagnosis not present

## 2018-05-14 DIAGNOSIS — N183 Chronic kidney disease, stage 3 (moderate): Secondary | ICD-10-CM | POA: Diagnosis not present

## 2018-05-14 DIAGNOSIS — E785 Hyperlipidemia, unspecified: Secondary | ICD-10-CM | POA: Diagnosis not present

## 2018-05-14 DIAGNOSIS — N189 Chronic kidney disease, unspecified: Secondary | ICD-10-CM | POA: Diagnosis not present

## 2018-05-21 ENCOUNTER — Ambulatory Visit: Payer: Medicare HMO | Admitting: Podiatry

## 2018-05-21 ENCOUNTER — Ambulatory Visit (INDEPENDENT_AMBULATORY_CARE_PROVIDER_SITE_OTHER): Payer: Medicare HMO

## 2018-05-21 DIAGNOSIS — S92501A Displaced unspecified fracture of right lesser toe(s), initial encounter for closed fracture: Secondary | ICD-10-CM | POA: Diagnosis not present

## 2018-05-21 DIAGNOSIS — M779 Enthesopathy, unspecified: Secondary | ICD-10-CM

## 2018-05-21 DIAGNOSIS — R600 Localized edema: Secondary | ICD-10-CM

## 2018-05-21 DIAGNOSIS — T148XXA Other injury of unspecified body region, initial encounter: Secondary | ICD-10-CM | POA: Diagnosis not present

## 2018-05-21 DIAGNOSIS — M7741 Metatarsalgia, right foot: Secondary | ICD-10-CM

## 2018-05-21 DIAGNOSIS — X503XXA Overexertion from repetitive movements, initial encounter: Secondary | ICD-10-CM

## 2018-05-21 DIAGNOSIS — M21961 Unspecified acquired deformity of right lower leg: Secondary | ICD-10-CM

## 2018-05-21 NOTE — Progress Notes (Signed)
Subjective:  Patient ID: Dylan Grant, male    DOB: Nov 10, 1950,  MRN: [id number]  Chief Complaint  Patient presents with  . Foot Pain    Right foot pain   67 y.o. male presents with the above complaint.  Last seen 2016.  Reports pain to the right foot for the past 3 weeks.  States that he was in the ocean and he had a wave and thinks he has been putting too much stress on his foot.  Reports tendon type pain to the right foot.  Also has pain in the ball of the right foot.  Has pain in the toes.  Review of Systems: Negative except as noted in the HPI. Denies N/V/F/Ch.  Past Medical History:  Diagnosis Date  . Allergy   . Cancer ([facility]) 2010   kidney  . Chronic kidney disease    kidney disease  . Diabetes mellitus without complication (HCC)    prediabetes  . GERD (gastroesophageal reflux disease)   . Hyperlipidemia   . Hypertension     Current Outpatient Medications:  .  amLODipine (NORVASC) 10 MG tablet, Take 10 mg by mouth daily., Disp: , Rfl: 3 .  aspirin EC 81 MG tablet, Take 81 mg by mouth daily., Disp: , Rfl:  .  cetirizine (ZYRTEC) 10 MG tablet, Take 10 mg by mouth daily., Disp: , Rfl:  .  Cholecalciferol (VITAMIN D) 2000 UNITS tablet, Take 2,000 Units by mouth daily., Disp: , Rfl:  .  Coenzyme Q10 50 MG CAPS, Take 1 capsule by mouth daily., Disp: , Rfl:  .  docusate sodium (COLACE) 100 MG capsule, Take 100 mg by mouth daily. , Disp: , Rfl:  .  doxazosin (CARDURA) 4 MG tablet, Take 4 mg by mouth daily., Disp: , Rfl:  .  fluorouracil (EFUDEX) 5 % cream, Apply topically 2 (two) times daily. (Patient not taking: Reported on 11/07/2017), Disp: 40 g, Rfl: 0 .  fluticasone (FLONASE) 50 MCG/ACT nasal spray, Place 1 spray into both nostrils daily. , Disp: , Rfl:  .  losartan-hydrochlorothiazide (HYZAAR) 100-12.5 MG per tablet, Take 1 tablet by mouth daily., Disp: , Rfl:  .  METFORMIN HCL PO, Take 500 mg by mouth 2 (two) times daily. , Disp: , Rfl:  .  Multiple Vitamin (MULTIVITAMIN  WITH MINERALS) TABS tablet, Take 1 tablet by mouth daily., Disp: , Rfl:  .  omega-3 acid ethyl esters (LOVAZA) 1 G capsule, Take 3 g by mouth daily., Disp: , Rfl:  .  oseltamivir (TAMIFLU) 75 MG capsule, Take 1 capsule (75 mg total) by mouth every 12 (twelve) hours., Disp: 10 capsule, Rfl: 0 .  tamsulosin (FLOMAX) 0.4 MG CAPS capsule, Take 1 capsule by mouth daily., Disp: , Rfl:   Social History   Tobacco Use  Smoking Status Never Smoker  Smokeless Tobacco Never Used    No Known Allergies Objective:  There were no vitals filed for this visit. There is no height or weight on file to calculate BMI. Constitutional Well developed. Well nourished.  Vascular Dorsalis pedis pulses palpable bilaterally. Posterior tibial pulses palpable bilaterally. Capillary refill normal to all digits.  No cyanosis or clubbing noted. Pedal hair growth normal.  Neurologic Normal speech. Oriented to person, place, and time. Epicritic sensation to light touch grossly present bilaterally.  Dermatologic Nails well groomed and normal in appearance. No open wounds. No skin lesions.  Orthopedic: Normal joint ROM without pain or crepitus bilaterally. No visible deformities. No bony tenderness. Pain to palpation about  the right third toe and proximal phalanx Pain palpation about the second MPJ Pain palpation about the third MPJ No pain palpation about the fifth MPJ base   Radiographs: Third proximal phalangeal chip fracture no other acute fractures.  Fifth metatarsal base enthesitis Assessment:   1. Capsulitis   2. Metatarsalgia of right foot   3. Deformity of metatarsal bone of right foot   4. Closed fracture of phalanx of right third toe, initial encounter   5. Localized edema   6. Enthesitis   7. Overuse injury    Plan:  Patient was evaluated and treated and all questions answered.  Capsulitis second third metatarsals -We will offload in a cam boot -Should pain persist after cam boot would  consider possible steroid pack for pain and inflammation  Right third proximal phalangeal fracture -Right second third toes strapped.  Educated on proper strapping. -Offloading Cam boot for the following 2 weeks -Soft cast consisting of Unna boot and Ace bandage applied for reduction of swelling and immobilization   Return in about 2 weeks (around 06/04/2018) for toe fracture, capsulitis f/u.

## 2018-05-23 DIAGNOSIS — E119 Type 2 diabetes mellitus without complications: Secondary | ICD-10-CM | POA: Diagnosis not present

## 2018-05-23 DIAGNOSIS — R739 Hyperglycemia, unspecified: Secondary | ICD-10-CM | POA: Diagnosis not present

## 2018-05-23 DIAGNOSIS — R2689 Other abnormalities of gait and mobility: Secondary | ICD-10-CM | POA: Diagnosis not present

## 2018-05-23 DIAGNOSIS — Z7984 Long term (current) use of oral hypoglycemic drugs: Secondary | ICD-10-CM | POA: Diagnosis not present

## 2018-05-30 DIAGNOSIS — E119 Type 2 diabetes mellitus without complications: Secondary | ICD-10-CM | POA: Diagnosis not present

## 2018-05-30 DIAGNOSIS — I10 Essential (primary) hypertension: Secondary | ICD-10-CM | POA: Diagnosis not present

## 2018-05-30 DIAGNOSIS — E781 Pure hyperglyceridemia: Secondary | ICD-10-CM | POA: Diagnosis not present

## 2018-05-30 DIAGNOSIS — E8881 Metabolic syndrome: Secondary | ICD-10-CM | POA: Diagnosis not present

## 2018-06-06 ENCOUNTER — Ambulatory Visit: Payer: Managed Care, Other (non HMO) | Admitting: Podiatry

## 2018-06-06 ENCOUNTER — Ambulatory Visit (INDEPENDENT_AMBULATORY_CARE_PROVIDER_SITE_OTHER): Payer: Medicare HMO

## 2018-06-06 DIAGNOSIS — M21961 Unspecified acquired deformity of right lower leg: Secondary | ICD-10-CM | POA: Diagnosis not present

## 2018-06-06 DIAGNOSIS — M779 Enthesopathy, unspecified: Secondary | ICD-10-CM

## 2018-06-06 DIAGNOSIS — S92501A Displaced unspecified fracture of right lesser toe(s), initial encounter for closed fracture: Secondary | ICD-10-CM

## 2018-06-06 DIAGNOSIS — M7741 Metatarsalgia, right foot: Secondary | ICD-10-CM | POA: Diagnosis not present

## 2018-06-06 NOTE — Progress Notes (Signed)
Subjective:  Patient ID: NIL Dylan Grant, male    DOB: 07-30-1951,  MRN: [id number]  Chief Complaint  Patient presents with  . Fracture    2 week follow up   67 y.o. male presents with the above complaint.  States that the pain in his foot is a lot better.  Wearing a boot.  States that the compression dressing did help with swelling.  No new complaints  Review of Systems: Negative except as noted in the HPI. Denies N/V/F/Ch.  Past Medical History:  Diagnosis Date  . Allergy   . Cancer ([place]) 2010   kidney  . Chronic kidney disease    kidney disease  . Diabetes mellitus without complication (HCC)    prediabetes  . GERD (gastroesophageal reflux disease)   . Hyperlipidemia   . Hypertension     Current Outpatient Medications:  .  amLODipine (NORVASC) 10 MG tablet, Take 10 mg by mouth daily., Disp: , Rfl: 3 .  aspirin EC 81 MG tablet, Take 81 mg by mouth daily., Disp: , Rfl:  .  cetirizine (ZYRTEC) 10 MG tablet, Take 10 mg by mouth daily., Disp: , Rfl:  .  Cholecalciferol (VITAMIN D) 2000 UNITS tablet, Take 2,000 Units by mouth daily., Disp: , Rfl:  .  Coenzyme Q10 50 MG CAPS, Take 1 capsule by mouth daily., Disp: , Rfl:  .  docusate sodium (COLACE) 100 MG capsule, Take 100 mg by mouth daily. , Disp: , Rfl:  .  doxazosin (CARDURA) 4 MG tablet, Take 4 mg by mouth daily., Disp: , Rfl:  .  fluorouracil (EFUDEX) 5 % cream, Apply topically 2 (two) times daily. (Patient not taking: Reported on 11/07/2017), Disp: 40 g, Rfl: 0 .  fluticasone (FLONASE) 50 MCG/ACT nasal spray, Place 1 spray into both nostrils daily. , Disp: , Rfl:  .  losartan-hydrochlorothiazide (HYZAAR) 100-12.5 MG per tablet, Take 1 tablet by mouth daily., Disp: , Rfl:  .  METFORMIN HCL PO, Take 500 mg by mouth 2 (two) times daily. , Disp: , Rfl:  .  Multiple Vitamin (MULTIVITAMIN WITH MINERALS) TABS tablet, Take 1 tablet by mouth daily., Disp: , Rfl:  .  omega-3 acid ethyl esters (LOVAZA) 1 G capsule, Take 3 g by mouth  daily., Disp: , Rfl:  .  oseltamivir (TAMIFLU) 75 MG capsule, Take 1 capsule (75 mg total) by mouth every 12 (twelve) hours., Disp: 10 capsule, Rfl: 0 .  tamsulosin (FLOMAX) 0.4 MG CAPS capsule, Take 1 capsule by mouth daily., Disp: , Rfl:   Social History   Tobacco Use  Smoking Status Never Smoker  Smokeless Tobacco Never Used    No Known Allergies Objective:  There were no vitals filed for this visit. There is no height or weight on file to calculate BMI. Constitutional Well developed. Well nourished.  Vascular Dorsalis pedis pulses palpable bilaterally. Posterior tibial pulses palpable bilaterally. Capillary refill normal to all digits.  No cyanosis or clubbing noted. Pedal hair growth normal.  Neurologic Normal speech. Oriented to person, place, and time. Epicritic sensation to light touch grossly present bilaterally.  Dermatologic Nails well groomed and normal in appearance. No open wounds. No skin lesions.  Orthopedic: Normal joint ROM without pain or crepitus bilaterally. No visible deformities. No bony tenderness. Pain to palpation about the right third toe and proximal phalanx Slight pain palpation about the second MPJ Slight pain palpation about the third MPJ No pain palpation about the fifth MPJ base   Radiographs: Third proximal phalangeal chip fracture  no other acute fractures.  Fifth metatarsal base enthesitis Assessment:   1. Closed fracture of phalanx of right third toe, initial encounter    Plan:  Patient was evaluated and treated and all questions answered.  Capsulitis second third metatarsals -No longer red and swollen today.  Will slowly transition out of cam boot. -Should capsulitis recur would consider possible steroid oral or injection. -Advised to monitor closely for signs of recurrence.  Right third proximal phalangeal fracture -Stable per x-ray.  Return if symptoms worsen or fail to improve.

## 2018-06-18 DIAGNOSIS — M109 Gout, unspecified: Secondary | ICD-10-CM | POA: Diagnosis not present

## 2018-06-18 DIAGNOSIS — R69 Illness, unspecified: Secondary | ICD-10-CM | POA: Diagnosis not present

## 2018-06-18 DIAGNOSIS — E781 Pure hyperglyceridemia: Secondary | ICD-10-CM | POA: Diagnosis not present

## 2018-07-07 DIAGNOSIS — R69 Illness, unspecified: Secondary | ICD-10-CM | POA: Diagnosis not present

## 2018-07-29 DIAGNOSIS — H40013 Open angle with borderline findings, low risk, bilateral: Secondary | ICD-10-CM | POA: Diagnosis not present

## 2018-07-29 DIAGNOSIS — H35033 Hypertensive retinopathy, bilateral: Secondary | ICD-10-CM | POA: Diagnosis not present

## 2018-07-29 DIAGNOSIS — H33321 Round hole, right eye: Secondary | ICD-10-CM | POA: Diagnosis not present

## 2018-07-29 DIAGNOSIS — E119 Type 2 diabetes mellitus without complications: Secondary | ICD-10-CM | POA: Diagnosis not present

## 2018-07-30 ENCOUNTER — Other Ambulatory Visit: Payer: Self-pay | Admitting: Urology

## 2018-07-30 ENCOUNTER — Ambulatory Visit (HOSPITAL_COMMUNITY)
Admission: RE | Admit: 2018-07-30 | Discharge: 2018-07-30 | Disposition: A | Discharge status: Home / Self Care | Payer: Medicare HMO | Source: Ambulatory Visit | Attending: Urology | Admitting: Urology

## 2018-07-30 DIAGNOSIS — C642 Malignant neoplasm of left kidney, except renal pelvis: Secondary | ICD-10-CM

## 2018-07-30 DIAGNOSIS — R3915 Urgency of urination: Secondary | ICD-10-CM | POA: Diagnosis not present

## 2018-07-31 ENCOUNTER — Ambulatory Visit: Payer: Managed Care, Other (non HMO) | Admitting: Neurology

## 2018-08-01 DIAGNOSIS — N189 Chronic kidney disease, unspecified: Secondary | ICD-10-CM | POA: Diagnosis not present

## 2018-08-01 DIAGNOSIS — E785 Hyperlipidemia, unspecified: Secondary | ICD-10-CM | POA: Diagnosis not present

## 2018-08-01 DIAGNOSIS — N183 Chronic kidney disease, stage 3 (moderate): Secondary | ICD-10-CM | POA: Diagnosis not present

## 2018-09-30 DIAGNOSIS — I10 Essential (primary) hypertension: Secondary | ICD-10-CM | POA: Diagnosis not present

## 2018-09-30 DIAGNOSIS — E785 Hyperlipidemia, unspecified: Secondary | ICD-10-CM | POA: Diagnosis not present

## 2018-09-30 DIAGNOSIS — M109 Gout, unspecified: Secondary | ICD-10-CM | POA: Diagnosis not present

## 2018-09-30 DIAGNOSIS — E119 Type 2 diabetes mellitus without complications: Secondary | ICD-10-CM | POA: Diagnosis not present

## 2018-09-30 DIAGNOSIS — Z125 Encounter for screening for malignant neoplasm of prostate: Secondary | ICD-10-CM | POA: Diagnosis not present

## 2018-09-30 DIAGNOSIS — G4733 Obstructive sleep apnea (adult) (pediatric): Secondary | ICD-10-CM | POA: Diagnosis not present

## 2018-09-30 DIAGNOSIS — J301 Allergic rhinitis due to pollen: Secondary | ICD-10-CM | POA: Diagnosis not present

## 2018-09-30 DIAGNOSIS — Z0001 Encounter for general adult medical examination with abnormal findings: Secondary | ICD-10-CM | POA: Diagnosis not present

## 2018-09-30 DIAGNOSIS — E8881 Metabolic syndrome: Secondary | ICD-10-CM | POA: Diagnosis not present

## 2018-09-30 DIAGNOSIS — N183 Chronic kidney disease, stage 3 (moderate): Secondary | ICD-10-CM | POA: Diagnosis not present

## 2018-10-16 DIAGNOSIS — N183 Chronic kidney disease, stage 3 (moderate): Secondary | ICD-10-CM | POA: Diagnosis not present

## 2018-10-16 DIAGNOSIS — E785 Hyperlipidemia, unspecified: Secondary | ICD-10-CM | POA: Diagnosis not present

## 2018-10-16 DIAGNOSIS — I129 Hypertensive chronic kidney disease with stage 1 through stage 4 chronic kidney disease, or unspecified chronic kidney disease: Secondary | ICD-10-CM | POA: Diagnosis not present

## 2018-10-16 DIAGNOSIS — D631 Anemia in chronic kidney disease: Secondary | ICD-10-CM | POA: Diagnosis not present

## 2018-10-16 DIAGNOSIS — C649 Malignant neoplasm of unspecified kidney, except renal pelvis: Secondary | ICD-10-CM | POA: Diagnosis not present

## 2018-10-16 DIAGNOSIS — R739 Hyperglycemia, unspecified: Secondary | ICD-10-CM | POA: Diagnosis not present

## 2018-11-13 DIAGNOSIS — N189 Chronic kidney disease, unspecified: Secondary | ICD-10-CM | POA: Diagnosis not present

## 2018-11-13 DIAGNOSIS — N183 Chronic kidney disease, stage 3 (moderate): Secondary | ICD-10-CM | POA: Diagnosis not present

## 2018-11-13 DIAGNOSIS — E785 Hyperlipidemia, unspecified: Secondary | ICD-10-CM | POA: Diagnosis not present

## 2018-11-28 ENCOUNTER — Ambulatory Visit: Payer: Medicare HMO | Admitting: Cardiology

## 2018-11-28 ENCOUNTER — Encounter: Payer: Self-pay | Admitting: Cardiology

## 2018-11-28 VITALS — BP 159/93 | HR 68 | Ht 68.0 in | Wt 207.0 lb

## 2018-11-28 DIAGNOSIS — I10 Essential (primary) hypertension: Secondary | ICD-10-CM

## 2018-11-28 DIAGNOSIS — E1165 Type 2 diabetes mellitus with hyperglycemia: Secondary | ICD-10-CM | POA: Insufficient documentation

## 2018-11-28 DIAGNOSIS — E781 Pure hyperglyceridemia: Secondary | ICD-10-CM | POA: Diagnosis not present

## 2018-11-28 NOTE — Progress Notes (Signed)
Patient is here for follow up visit.  Subjective:   Dylan Grant, male    DOB: 02/25/1951, 68 y.o.   MRN: [id number]   Chief Complaint  Patient presents with  . Hypertension    F/U     HPI  68 year old Caucasian male with hypertension, controlled type II diabetes mellitus, negatuve stress test 2019, here for follow up of his hypertriglyceridemia.  Patient exercises regularly, including an hour on treadmill, without any symptoms. His BP is usually well controlled, however elevated at office visit today. He has been on Lovaza without any significant change in his triglyceride level. He reports that his blood sugar has been elevated.   Patient was originally seen by me for an episode of chest pain, which he has not had since.   Past Medical History:  Diagnosis Date  . Allergy   . Cancer ([place]) 2010   kidney  . Chronic kidney disease    kidney disease  . Diabetes mellitus without complication (HCC)    prediabetes  . GERD (gastroesophageal reflux disease)   . Hyperlipidemia   . Hypertension      Past Surgical History:  Procedure Laterality Date  . ANAL FISSURE REPAIR    . CATARACT EXTRACTION Bilateral 2012  . NEPHRECTOMY Left 2010   kidney cancer     Social History   Socioeconomic History  . Marital status: Married    Spouse name: Not on file  . Number of children: 3  . Years of education: Not on file  . Highest education level: Not on file  Occupational History  . Not on file  Social Needs  . Financial resource strain: Not on file  . Food insecurity:    Worry: Not on file    Inability: Not on file  . Transportation needs:    Medical: Not on file    Non-medical: Not on file  Tobacco Use  . Smoking status: Never Smoker  . Smokeless tobacco: Never Used  Substance and Sexual Activity  . Alcohol use: No    Alcohol/week: 0.0 standard drinks  . Drug use: No  . Sexual activity: Not on file  Lifestyle  . Physical activity:    Days per week: Not on  file    Minutes per session: Not on file  . Stress: Not on file  Relationships  . Social connections:    Talks on phone: Not on file    Gets together: Not on file    Attends religious service: Not on file    Active member of club or organization: Not on file    Attends meetings of clubs or organizations: Not on file    Relationship status: Not on file  . Intimate partner violence:    Fear of current or ex partner: Not on file    Emotionally abused: Not on file    Physically abused: Not on file    Forced sexual activity: Not on file  Other Topics Concern  . Not on file  Social History Narrative  . Not on file     Current Outpatient Medications on File Prior to Visit  Medication Sig Dispense Refill  . allopurinol (ZYLOPRIM) 100 MG tablet Take 1 tablet by mouth daily.    Marland Kitchen amLODipine (NORVASC) 10 MG tablet Take 10 mg by mouth daily.  3  . aspirin EC 81 MG tablet Take 81 mg by mouth daily.    . cetirizine (ZYRTEC) 10 MG tablet Take 10 mg by mouth daily.    Marland Kitchen  Cholecalciferol (VITAMIN D) 2000 UNITS tablet Take 2,000 Units by mouth daily.    . Coenzyme Q10 50 MG CAPS Take 1 capsule by mouth daily.    Marland Kitchen docusate sodium (COLACE) 100 MG capsule Take 100 mg by mouth daily.     . famotidine (PEPCID) 20 MG tablet Take 1 tablet by mouth daily.    . fluticasone (FLONASE) 50 MCG/ACT nasal spray Place 1 spray into both nostrils daily.     Marland Kitchen losartan (COZAAR) 100 MG tablet Take 100 mg by mouth daily.    Marland Kitchen METFORMIN HCL PO Take 500 mg by mouth 2 (two) times daily.     . Multiple Vitamin (MULTIVITAMIN WITH MINERALS) TABS tablet Take 1 tablet by mouth daily.    Marland Kitchen omega-3 acid ethyl esters (LOVAZA) 1 G capsule Take 3 g by mouth daily.    . rosuvastatin (CRESTOR) 20 MG tablet Take 20 mg by mouth daily.    . tamsulosin (FLOMAX) 0.4 MG CAPS capsule Take 1 capsule by mouth daily.    Marland Kitchen doxazosin (CARDURA) 4 MG tablet Take 4 mg by mouth daily.    . fluorouracil (EFUDEX) 5 % cream Apply topically 2 (two)  times daily. (Patient not taking: Reported on 11/07/2017) 40 g 0   No current facility-administered medications on file prior to visit.     Cardiovascular studies:   EKG 11/28/2018: Sinus rhythm 65 bpm First degree A-V block  Old anteroseptal infarct Unchanged compared to previous EKG  Echocardiogram 11/14/2017: 1. Left ventricle cavity is normal in size. Mild concentric hypertrophy of the left ventricle. Normal global wall motion. Visual EF is 50-55%. Doppler evidence of grade I (impaired) diastolic dysfunction. 2. Trace mitral regurgitation. 3. Mild tricuspid regurgitation. No evidence of pulmonary Hypertension.  Exercise sestamibi stress test 11/22/2017:  1. The patient performed treadmill exercise using a Bruce protocol, completing 11:50 minutes. The patient completed an estimated workload of 13.48 METS, reaching 106% of the maximum predicted heart rate. Excellent exercise capacity. Normal resting blood pressure with expected peak blood pressure response. No stress symptoms.  2. The overall quality of the study is good. There is no evidence of abnormal lung activity. Stress and rest SPECT images demonstrate homogeneous tracer distribution throughout the myocardium. Gated SPECT imaging reveals normal myocardial thickening and wall motion. The left ventricular ejection fraction was normal (76%).   3. Low risk study.  EKG 10/22/2016: Sinus bradycardia 50 bpm.  Normal axis.  Normal conduction.   Recent labs:  10/2018: TG 262  07/2019: TG 165  Labs 05/15/2018: H/H 15/45. MCV 93. Platelets 209.  Glucose 157, BUN/Cr 13/1.28 eGFR 58 Chol 72. TG 247. Calc LDL 7, HDL 26  Labs 12/31/2017: H/H 14.7/41.6. MCV 92/ Platelets 180.  Glucose 145. BUN/Cr 17/1.23. eGFR 59/71/. Na/K 140/4.0 Rest of CMP normal Uric acid 8.0 elevated Chol 70, TG 204, HDL 25, direct LDL 42 Ur. microalbumin 3.7 elevated TSH, Vit D normal  Labs 07/17/2017: Glucose 130.  BUN/creatinine 17/1.3.  EGFR  55/67. Sodium 141, potassium 4.0 Cholesterol 108, HDL 26, triglycerides 290, LDL calculated 24 Hemoglobin A1c 6.0 Normal TSH Uric acid 9.7  Elevated   Review of Systems  Constitution: Negative for decreased appetite, malaise/fatigue, weight gain and weight loss.  HENT: Negative for congestion.   Eyes: Negative for visual disturbance.  Cardiovascular: Negative for chest pain, dyspnea on exertion, leg swelling, palpitations and syncope.  Respiratory: Negative for shortness of breath.   Endocrine: Negative for cold intolerance.  Hematologic/Lymphatic: Does not bruise/bleed easily.  Skin:  Negative for itching and rash.  Musculoskeletal: Negative for myalgias.  Gastrointestinal: Negative for abdominal pain, nausea and vomiting.  Genitourinary: Negative for dysuria.  Neurological: Negative for dizziness and weakness.  Psychiatric/Behavioral: The patient is not nervous/anxious.   All other systems reviewed and are negative.      Objective:    Vitals:   11/28/18 1432  BP: (!) 159/93  Pulse: 68  SpO2: 98%     Physical Exam  Constitutional: He is oriented to person, place, and time. He appears well-developed and well-nourished. No distress.  HENT:  Head: Normocephalic and atraumatic.  Eyes: Pupils are equal, round, and reactive to light. Conjunctivae are normal.  Neck: No JVD present.  Cardiovascular: Normal rate, regular rhythm and intact distal pulses.  Pulmonary/Chest: Effort normal and breath sounds normal. He has no wheezes. He has no rales.  Abdominal: Soft. Bowel sounds are normal. There is no rebound.  Musculoskeletal:        General: No edema.  Lymphadenopathy:    He has no cervical adenopathy.  Neurological: He is alert and oriented to person, place, and time. No cranial nerve deficit.  Skin: Skin is warm and dry.  Psychiatric: He has a normal mood and affect.  Nursing note and vitals reviewed.       Assessment & Recommendations:    68 year old Caucasian  male with hypertension, controlled type II diabetes mellitus, negatuve stress test 2019, here for follow up of his hypertriglyceridemia.  1. Hypertension, unspecified type Unusually higher BP today. I have not made any change to his therapy today.  He has an upcoming office visit with his PCP Dr. Inda Merlin next week.  Blood pressure still elevated, could add another agent, such as spironolactone.  2. Uncontrolled type 2 diabetes mellitus with hyperglycemia (HCC) Follow-up with Dr. Inda Merlin.  3. Hypertriglyceridemia His increased triglycerides can be explained by uncontrolled diabetes at this time.  Patient was originally seen by me for an episode of chest pain, which he has not had since. Cardiac workup has been reassuring. I will see him on as-needed basis.  Nigel Mormon, MD [facility] Pager: [phone] Office: [phone] If no answer Cell [phone]

## 2018-12-01 DIAGNOSIS — H40013 Open angle with borderline findings, low risk, bilateral: Secondary | ICD-10-CM | POA: Diagnosis not present

## 2018-12-01 DIAGNOSIS — H01003 Unspecified blepharitis right eye, unspecified eyelid: Secondary | ICD-10-CM | POA: Diagnosis not present

## 2018-12-08 ENCOUNTER — Other Ambulatory Visit: Payer: Self-pay

## 2018-12-08 DIAGNOSIS — E119 Type 2 diabetes mellitus without complications: Secondary | ICD-10-CM | POA: Diagnosis not present

## 2018-12-08 MED ORDER — ROSUVASTATIN CALCIUM 20 MG PO TABS: 20.0000 mg | ORAL_TABLET | Freq: Every day | ORAL | 2 refills | Status: DC

## 2019-02-13 DIAGNOSIS — E1165 Type 2 diabetes mellitus with hyperglycemia: Secondary | ICD-10-CM | POA: Diagnosis not present

## 2019-02-13 DIAGNOSIS — E785 Hyperlipidemia, unspecified: Secondary | ICD-10-CM | POA: Diagnosis not present

## 2019-02-13 DIAGNOSIS — N183 Chronic kidney disease, stage 3 (moderate): Secondary | ICD-10-CM | POA: Diagnosis not present

## 2019-02-13 DIAGNOSIS — N189 Chronic kidney disease, unspecified: Secondary | ICD-10-CM | POA: Diagnosis not present

## 2019-04-22 DIAGNOSIS — E119 Type 2 diabetes mellitus without complications: Secondary | ICD-10-CM | POA: Diagnosis not present

## 2019-04-22 DIAGNOSIS — E782 Mixed hyperlipidemia: Secondary | ICD-10-CM | POA: Diagnosis not present

## 2019-04-22 DIAGNOSIS — I10 Essential (primary) hypertension: Secondary | ICD-10-CM | POA: Diagnosis not present

## 2019-04-22 DIAGNOSIS — R51 Headache: Secondary | ICD-10-CM | POA: Diagnosis not present

## 2019-04-22 DIAGNOSIS — E878 Other disorders of electrolyte and fluid balance, not elsewhere classified: Secondary | ICD-10-CM | POA: Diagnosis not present

## 2019-05-28 DIAGNOSIS — N183 Chronic kidney disease, stage 3 (moderate): Secondary | ICD-10-CM | POA: Diagnosis not present

## 2019-05-28 DIAGNOSIS — N189 Chronic kidney disease, unspecified: Secondary | ICD-10-CM | POA: Diagnosis not present

## 2019-05-28 DIAGNOSIS — E785 Hyperlipidemia, unspecified: Secondary | ICD-10-CM | POA: Diagnosis not present

## 2019-05-28 DIAGNOSIS — E1165 Type 2 diabetes mellitus with hyperglycemia: Secondary | ICD-10-CM | POA: Diagnosis not present

## 2019-06-19 DIAGNOSIS — R739 Hyperglycemia, unspecified: Secondary | ICD-10-CM | POA: Diagnosis not present

## 2019-06-19 DIAGNOSIS — Z1159 Encounter for screening for other viral diseases: Secondary | ICD-10-CM | POA: Diagnosis not present

## 2019-06-19 DIAGNOSIS — Z23 Encounter for immunization: Secondary | ICD-10-CM | POA: Diagnosis not present

## 2019-06-19 DIAGNOSIS — I129 Hypertensive chronic kidney disease with stage 1 through stage 4 chronic kidney disease, or unspecified chronic kidney disease: Secondary | ICD-10-CM | POA: Diagnosis not present

## 2019-06-19 DIAGNOSIS — C649 Malignant neoplasm of unspecified kidney, except renal pelvis: Secondary | ICD-10-CM | POA: Diagnosis not present

## 2019-06-19 DIAGNOSIS — E785 Hyperlipidemia, unspecified: Secondary | ICD-10-CM | POA: Diagnosis not present

## 2019-06-19 DIAGNOSIS — N183 Chronic kidney disease, stage 3 (moderate): Secondary | ICD-10-CM | POA: Diagnosis not present

## 2019-06-19 DIAGNOSIS — D631 Anemia in chronic kidney disease: Secondary | ICD-10-CM | POA: Diagnosis not present

## 2019-06-23 DIAGNOSIS — R69 Illness, unspecified: Secondary | ICD-10-CM | POA: Diagnosis not present

## 2019-09-03 ENCOUNTER — Other Ambulatory Visit: Payer: Self-pay | Admitting: Cardiology

## 2019-09-03 DIAGNOSIS — N189 Chronic kidney disease, unspecified: Secondary | ICD-10-CM | POA: Diagnosis not present

## 2019-09-03 DIAGNOSIS — N1831 Chronic kidney disease, stage 3a: Secondary | ICD-10-CM | POA: Diagnosis not present

## 2019-09-03 DIAGNOSIS — E785 Hyperlipidemia, unspecified: Secondary | ICD-10-CM | POA: Diagnosis not present

## 2019-09-14 DIAGNOSIS — I129 Hypertensive chronic kidney disease with stage 1 through stage 4 chronic kidney disease, or unspecified chronic kidney disease: Secondary | ICD-10-CM | POA: Diagnosis not present

## 2019-09-14 DIAGNOSIS — C649 Malignant neoplasm of unspecified kidney, except renal pelvis: Secondary | ICD-10-CM | POA: Diagnosis not present

## 2019-09-14 DIAGNOSIS — N1831 Chronic kidney disease, stage 3a: Secondary | ICD-10-CM | POA: Diagnosis not present

## 2019-09-14 DIAGNOSIS — D631 Anemia in chronic kidney disease: Secondary | ICD-10-CM | POA: Diagnosis not present

## 2019-09-14 DIAGNOSIS — R739 Hyperglycemia, unspecified: Secondary | ICD-10-CM | POA: Diagnosis not present

## 2019-09-14 DIAGNOSIS — E785 Hyperlipidemia, unspecified: Secondary | ICD-10-CM | POA: Diagnosis not present

## 2019-10-09 ENCOUNTER — Other Ambulatory Visit: Payer: Self-pay | Admitting: Internal Medicine

## 2019-10-09 ENCOUNTER — Ambulatory Visit
Admission: RE | Admit: 2019-10-09 | Discharge: 2019-10-09 | Disposition: A | Discharge status: Home / Self Care | Payer: Medicare HMO | Source: Ambulatory Visit | Attending: Internal Medicine | Admitting: Internal Medicine

## 2019-10-09 DIAGNOSIS — I10 Essential (primary) hypertension: Secondary | ICD-10-CM | POA: Diagnosis not present

## 2019-10-09 DIAGNOSIS — R1030 Lower abdominal pain, unspecified: Secondary | ICD-10-CM | POA: Diagnosis not present

## 2019-10-09 DIAGNOSIS — Z125 Encounter for screening for malignant neoplasm of prostate: Secondary | ICD-10-CM | POA: Diagnosis not present

## 2019-10-09 DIAGNOSIS — Z23 Encounter for immunization: Secondary | ICD-10-CM | POA: Diagnosis not present

## 2019-10-09 DIAGNOSIS — E878 Other disorders of electrolyte and fluid balance, not elsewhere classified: Secondary | ICD-10-CM | POA: Diagnosis not present

## 2019-10-09 DIAGNOSIS — Z1211 Encounter for screening for malignant neoplasm of colon: Secondary | ICD-10-CM | POA: Diagnosis not present

## 2019-10-09 DIAGNOSIS — M25552 Pain in left hip: Secondary | ICD-10-CM | POA: Diagnosis not present

## 2019-10-09 DIAGNOSIS — R1032 Left lower quadrant pain: Secondary | ICD-10-CM

## 2019-10-09 DIAGNOSIS — E119 Type 2 diabetes mellitus without complications: Secondary | ICD-10-CM | POA: Diagnosis not present

## 2019-10-09 DIAGNOSIS — R001 Bradycardia, unspecified: Secondary | ICD-10-CM | POA: Diagnosis not present

## 2019-10-09 DIAGNOSIS — E782 Mixed hyperlipidemia: Secondary | ICD-10-CM | POA: Diagnosis not present

## 2019-10-09 DIAGNOSIS — M1612 Unilateral primary osteoarthritis, left hip: Secondary | ICD-10-CM | POA: Diagnosis not present

## 2019-10-09 DIAGNOSIS — Z0001 Encounter for general adult medical examination with abnormal findings: Secondary | ICD-10-CM | POA: Diagnosis not present

## 2019-10-15 DIAGNOSIS — N1831 Chronic kidney disease, stage 3a: Secondary | ICD-10-CM | POA: Diagnosis not present

## 2019-10-20 ENCOUNTER — Ambulatory Visit: Payer: PRIVATE HEALTH INSURANCE

## 2019-10-26 ENCOUNTER — Ambulatory Visit: Payer: Medicare HMO | Admitting: Orthopaedic Surgery

## 2019-10-26 ENCOUNTER — Ambulatory Visit (INDEPENDENT_AMBULATORY_CARE_PROVIDER_SITE_OTHER): Payer: Medicare HMO

## 2019-10-26 ENCOUNTER — Other Ambulatory Visit: Payer: Self-pay

## 2019-10-26 ENCOUNTER — Telehealth: Payer: Self-pay | Admitting: Orthopedic Surgery

## 2019-10-26 ENCOUNTER — Encounter: Payer: Self-pay | Admitting: Orthopaedic Surgery

## 2019-10-26 DIAGNOSIS — M25552 Pain in left hip: Secondary | ICD-10-CM

## 2019-10-26 DIAGNOSIS — M545 Low back pain, unspecified: Secondary | ICD-10-CM

## 2019-10-26 NOTE — Telephone Encounter (Signed)
Mr. Cunnane was seen this morning and Dr. Ninfa Linden suggested P.T. and a cortisone shot with Dr. Ernestina Patches.  Mr. Allshouse is getting his first COVID 19 vaccine this week and would like to hold off on the cortisone and P.T. until after 12/10/19.  He read that cortisone could change the effectiveness of the vaccine.

## 2019-10-26 NOTE — Progress Notes (Signed)
Office Visit Note   Patient: Dylan Grant           Date of Birth: [birth year]           MRN: [id number] Visit Date: 10/26/2019              Requested by: Josetta Huddle, MD [address] [place],  [place] [address] PCP: Josetta Huddle, MD   Assessment & Plan: Visit Diagnoses:  1. Low back pain, unspecified back pain laterality, unspecified chronicity, unspecified whether sciatica present   2. Pain in left hip     Plan: At this point I do feel he would benefit from outpatient physical therapy on his cervical spine, his lumbar spine and his left hip.  Any modalities that can help improve his motion and strength in his core would be helpful.  I would also like to send him to Dr. Ernestina Patches for a left hip injection under ultrasound.  This may need to be over the psoas tendon as well.  At some point we will likely need to MRI his lumbar spine or even his hip or both.  We will get a try to be as conservative at first with therapy and injections and he understands this.  All question concerns were answered addressed.  We will see him back after he has had a course of therapy and a left hip injection.  Follow-Up Instructions: Return in about 4 weeks (around 11/23/2019).   Orders:  Orders Placed This Encounter  Procedures  . XR Lumbar Spine 2-3 Views   No orders of the defined types were placed in this encounter.     Procedures: No procedures performed   Clinical Data: No additional findings.   Subjective: Chief Complaint  Patient presents with  . Left Hip - Pain  Patient is a very pleasant 69 year old gentleman referred from Dr. Inda Merlin to evaluate left-sided hip and low back pain.  The patient used to be very active going to the gym but with the coronavirus pandemic his activities have decreased.  He has been having a popping sensation around his left hip for long period time but he feels like his may have gotten worse with his inactivity and in correlation with some pain  has been occurring on his left side and low back that radiates deep into the thigh and some in the groin on the left side.  He has only one kidney so he cannot take anti-inflammatories.  There is no numbness and tingling but he cannot get in a good position sleep at night.  His left hip pops and clicks when he is flexing up his thigh and has become tight.  He is now unable to walk for long periods of time due to the pain.  It has been going on for about 5 years but the last 6 months has gotten worse in the last 4 months have been more painful.  He also reports some neck stiffness.  He saw a neurologist years ago due to radicular symptoms going on his left arm.  He said those did resolve with time.  However he still has significant stiffness in his back and neck.  He has a hard time getting up from a seated position and he states his wife states he looks like he is elderly when he walks.  HPI  Review of Systems He currently denies any headache, chest pain, shortness of breath, fever, chills, nausea, vomiting  Objective: Vital Signs: There were no vitals  taken for this visit.  Physical Exam He is alert and oriented x3 and in no acute distress Ortho Exam Examination of his left hip and right hip shows some slight stiffness but range of motion is full.  There was some pain on extremes of rotation around his hip on the left side that are not on his right.  There is no pain to palpation down the trochanteric area or the IT band.  There is some low back pain and pain on sciatic stretch.  He has good strength in his bilateral lower extremities.  His neck and lumbar spine are very stiff on exam. Specialty Comments:  No specialty comments available.  Imaging: XR Lumbar Spine 2-3 Views  Result Date: 10/26/2019 2 views of the lumbar spine show significant arthritic changes at multiple levels.  There is loss of his lumbar lordosis as well.  There is severe degenerative disc disease at multiple levels and large  osteophytes.  X-rays independently reviewed of his pelvis and both hips show no acute findings.  The hip joint space is still well-maintained.  There is a small calcification of the lateral trochanteric area on the left side but appears chronic.  This is in the IT band area just off of the left trochanteric side.  There is no significant narrowing of either hip joint itself.  PMFS History: Patient Active Problem List   Diagnosis Date Noted  . Hypertension 11/28/2018  . Uncontrolled type 2 diabetes mellitus with hyperglycemia ([place]) 11/28/2018  . Hypertriglyceridemia 11/28/2018  . Atypical chest pain 01/10/2014  . Chest pain 01/10/2014   Past Medical History:  Diagnosis Date  . Allergy   . Cancer ([place]) 2010   kidney  . Chronic kidney disease    kidney disease  . Diabetes mellitus without complication (HCC)    prediabetes  . GERD (gastroesophageal reflux disease)   . Hyperlipidemia   . Hypertension     Family History  Problem Relation Age of Onset  . Hypertension Mother   . Dementia Mother   . Heart attack Father   . Hyperlipidemia Brother   . Hypertension Brother   . Colon cancer Neg Hx     Past Surgical History:  Procedure Laterality Date  . ANAL FISSURE REPAIR    . CATARACT EXTRACTION Bilateral 2012  . NEPHRECTOMY Left 2010   kidney cancer   Social History   Occupational History  . Not on file  Tobacco Use  . Smoking status: Never Smoker  . Smokeless tobacco: Never Used  Substance and Sexual Activity  . Alcohol use: No    Alcohol/week: 0.0 standard drinks  . Drug use: No  . Sexual activity: Not on file

## 2019-10-29 ENCOUNTER — Ambulatory Visit: Payer: PRIVATE HEALTH INSURANCE | Attending: Internal Medicine

## 2019-10-29 DIAGNOSIS — Z23 Encounter for immunization: Secondary | ICD-10-CM | POA: Insufficient documentation

## 2019-10-29 NOTE — Progress Notes (Signed)
   Covid-19 Vaccination Clinic  Name:  KEMONTE WHICKER    MRN: [id number] DOB: [birth year]  10/29/2019  Mr. Dornbush was observed post Covid-19 immunization for 15 minutes without incidence. He was provided with Vaccine Information Sheet and instruction to access the V-Safe system.   Mr. Seminara was instructed to call 911 with any severe reactions post vaccine: Marland Kitchen Difficulty breathing  . Swelling of your face and throat  . A fast heartbeat  . A bad rash all over your body  . Dizziness and weakness    Immunizations Administered    Name Date Dose VIS Date Route   Pfizer COVID-19 Vaccine 10/29/2019  2:30 PM 0.3 mL 09/04/2019 Intramuscular   Manufacturer: [place]   Lot: [id number]   [place]: [id number]

## 2019-11-03 ENCOUNTER — Other Ambulatory Visit: Payer: Self-pay

## 2019-11-03 ENCOUNTER — Ambulatory Visit: Payer: Medicare HMO | Attending: Orthopaedic Surgery | Admitting: Physical Therapy

## 2019-11-03 DIAGNOSIS — G8929 Other chronic pain: Secondary | ICD-10-CM

## 2019-11-03 DIAGNOSIS — M545 Low back pain: Secondary | ICD-10-CM | POA: Insufficient documentation

## 2019-11-03 DIAGNOSIS — M542 Cervicalgia: Secondary | ICD-10-CM

## 2019-11-03 DIAGNOSIS — M6281 Muscle weakness (generalized): Secondary | ICD-10-CM | POA: Insufficient documentation

## 2019-11-03 DIAGNOSIS — R262 Difficulty in walking, not elsewhere classified: Secondary | ICD-10-CM

## 2019-11-03 NOTE — Therapy (Signed)
[facility] [address] [place], [place], [address] Phone: [phone]   Fax:  [phone]  Physical Therapy Evaluation  Patient Details  Name: Dylan Grant MRN: [id number] Date of Birth: [birth year] Referring Provider (PT): Dr. Ninfa Linden    Encounter Date: 11/03/2019  PT End of Session - 11/03/19 1839    Visit Number  1    Date for PT Re-Evaluation  12/29/19    Authorization Type  [employer] 10th visit progress note    PT Start Time  0850    PT Stop Time  0930    PT Time Calculation (min)  40 min    Activity Tolerance  Patient tolerated treatment well       Past Medical History:  Diagnosis Date  . Allergy   . Cancer ([place]) 2010   kidney  . Chronic kidney disease    kidney disease  . Diabetes mellitus without complication (HCC)    prediabetes  . GERD (gastroesophageal reflux disease)   . Hyperlipidemia   . Hypertension     Past Surgical History:  Procedure Laterality Date  . ANAL FISSURE REPAIR    . CATARACT EXTRACTION Bilateral 2012  . NEPHRECTOMY Left 2010   kidney cancer    There were no vitals filed for this visit.   Subjective Assessment - 11/03/19 0941    Subjective  I can't walk anymore, I can't exercise b/c of back.  History of left sciatica off and on  for years but worsened since October.  Prior to pandemic elliptical and treadmill for 1 hour.  Walked in the neighborhood 5-7 miles/day.  In October, left side stiffening up and wouldn't allow me to walk.  Especially afterwards, couldn't sleep or lie on back.    Better in last few weeks but I haven't attempted walking since early November.  My wife says I walk leaning to the side and stooped.    Pertinent History  Kidney Cancer (lost a kidney) other kidney is not great;  HTN; diabetes; neck issues can't lie with my head flat on the floor/ hurts at the dentist;  the medications I take make me weaker;  Kidney medicine might affect calcium  absorption but I've never had bone density test.    Limitations  Walking;House hold activities    How long can you sit comfortably?  depends on chair, crossing legs stretches    How long can you walk comfortably?  walking dogs OK 200-300 yards 1-2x/day    Diagnostic tests  x-rays severe DDD at multi levels and large osteophytes "significant arthritic changes";  no concerns about the hip    Patient Stated Goals  I want to be able to do regular exercise; make some improvement in posture; my wife wants me to not lean to the side    Currently in Pain?  Yes    Pain Score  5     Pain Location  Back    Pain Orientation  Right;Left    Aggravating Factors   walking, standing    Pain Score  0    Pain Location  Neck    Aggravating Factors   standing 5-10 min to cook, meal prep, wash dishes         [facility] PT Assessment - 11/03/19 0001      Assessment   Medical Diagnosis  low back pain; pain in left hip ; cervical spine     Referring Provider (PT)  Dr. Ninfa Linden  Onset Date/Surgical Date  --   October   Next MD Visit  March for possible steroid shot      Precautions   Precautions  None      Restrictions   Weight Bearing Restrictions  No      Balance Screen   Has the patient fallen in the past 6 months  No    Has the patient had a decrease in activity level because of a fear of falling?   No   balance is "not great"    Is the patient reluctant to leave their home because of a fear of falling?   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       Observation/Other Assessments   Focus on Therapeutic Outcomes (FOTO)   44% limitation       Posture/Postural Control   Posture/Postural Control  Postural limitations    Postural Limitations  Forward  head;Decreased lumbar lordosis;Increased thoracic kyphosis    Posture Comments  signficant forward head;  increased right thoracic kyphosis noted especially in prone       AROM   Right Hip External Rotation   45    Right Hip Internal Rotation   0    Left Hip External Rotation   45    Left Hip Internal Rotation   0    Cervical Flexion  35    Cervical Extension  40    Cervical - Right Side Bend  24    Cervical - Left Side Bend  20    Cervical - Right Rotation  44    Cervical - Left Rotation  50    Lumbar Flexion  55    Lumbar Extension  10    Lumbar - Right Side Bend  20    Lumbar - Left Side Bend  10      Strength   Right Hip Extension  4-/5    Right Hip ABduction  4-/5    Right Hip ADduction  4-/5    Left Hip Extension  4-/5    Left Hip ABduction  4-/5    Cervical Flexion  4/5    Cervical Extension  3/5    Lumbar Flexion  4/5    Lumbar Extension  4/5      Flexibility   Hamstrings  bil 65 degrees    Quadriceps  left hip flexor 0 degrees, right 5 degrees       Prone Knee Bend Test   Findings  Positive    Side  Left      Straight Leg Raise   Findings  Negative    Side   Left      Standardized Balance Assessment   Five times sit to stand comments   13 sec no UE assist                 Objective measurements completed on examination: See above findings.                PT Short Term Goals - 11/03/19 1859      PT SHORT TERM GOAL #1   Title  The patient will  demonstrate compliance with initial HEP and postural correction strategies    Time  4    Period  Weeks    Target Date  12/01/19      PT SHORT TERM GOAL #2   Title  The patient will be able to walk 3/4 mile with pain level during and after no more than 5/10    Time  4    Period  Weeks    Status  New      PT SHORT TERM GOAL #3   Title  The patient will have improved cervical extension to 50 degrees and lumbar extension to 15 degrees needed for more upright posture when standing    Time   4    Period  Weeks    Status  New      PT SHORT TERM GOAL #4   Title  The patient will have improved lumbo/pelvic/hip core strength to grossly 4/5 needed for standing > 10 minutes for meal prep and washing dishes    Time  4    Period  Weeks    Status  New        PT Long Term Goals - 11/03/19 1903      PT LONG TERM GOAL #1   Title  The patient will be independent with safe self progression of HEP    Time  8    Period  Weeks    Status  New    Target Date  12/29/19      PT LONG TERM GOAL #2   Title  The patient will report 4/10 pain with walking > 1 mile    Time  8    Period  Weeks    Status  New      PT LONG TERM GOAL #3   Title  Improved bil HS lengths to > 70 degrees and hip extension > 12 degrees needed for walking longer periods of time    Time  8    Period  Weeks    Status  New      PT LONG TERM GOAL #4   Title  Cervical extensors and lumbo/pelvic/hip core strength grossly 4+/5 needed for standing and walking longer periods of time    Time  8    Period  Weeks    Status  New      PT LONG TERM GOAL #5   Title  FOTO functional outcome score improved from 44% limitation to 32%    Time  8    Period  Weeks    Status  New             Plan - 11/03/19 1840    Clinical Impression Statement  The patient reports a history of back pain, intermittent sciatica on the left and difficulty standing erect off/on over several years however in October he suddently began to have signficant muscle tightening followed by pain.  The pain was intense enough that he could not sleep and he discontinued walking completely.  He previously walked 5-7 miles a day and prior to Covid, he was able to do the Elliptical and treadmill at the gym for an hour.  He has increased upper back and neck pain with standing to cook, meal prep and wash dishes more than 5-10 minutes.  Prominent head forward posture, decreased lumbar lordosis and right dorsal kyphosis noted in prone.  Decreased cervical  extension and bil sidebending ROM bilaterally.  Decreased lumbar ROM in all planes with moderate movement limitations:  flexion 55, extension 10, right sidebend 20 and left sidebend 10 degrees.  Decreased bil hip internal rotation and decreased bil HS and hip flexor lengths.  Decreased lumbo/pelvic/hip core strength particularly hip abductors, lower abdominals and cervical extensors.  He has a moderate functional impairment with a FOTO score of 44%.  The patient would benefit from PT to address these deficts.    Personal Factors and Comorbidities  Time since onset of injury/illness/exacerbation;Comorbidity 1;Age;Comorbidity 2;Comorbidity 3+    Comorbidities  chronic history of spinal pain;  kidney disease; HTN; diabetes    Examination-Activity Limitations  Locomotion Level;Stand    Examination-Participation Restrictions  Meal Prep;Community Activity;Other    Stability/Clinical Decision Making  Stable/Uncomplicated    Clinical Decision Making  Low    Rehab Potential  Good    PT Frequency  2x / week    PT Duration  8 weeks    PT Treatment/Interventions  ADLs/Self Care Home Management;Electrical Stimulation;Cryotherapy;Ultrasound;Traction;Moist Heat;Therapeutic activities;Therapeutic exercise;Neuromuscular re-education;Manual techniques;Patient/family education;Dry needling;Taping    PT Next Visit Plan  initate HEP:  HS stretch, hip flexor stretch; scapular retraction; hip abduction and hip extension strengthening, transverse abdominus activation; postural correction (if lying prone will need to need accomodation for neck); Nu-Step    Consulted and Agree with Plan of Care  Patient       Patient will benefit from skilled therapeutic intervention in order to improve the following deficits and impairments:  Decreased range of motion, Difficulty walking, Pain, Decreased activity tolerance, Impaired perceived functional ability, Impaired flexibility, Decreased strength, Postural dysfunction  Visit  Diagnosis: Chronic low back pain, unspecified back pain laterality, unspecified whether sciatica present - Plan: PT plan of care cert/re-cert  Muscle weakness (generalized) - Plan: PT plan of care cert/re-cert  Cervicalgia - Plan: PT plan of care cert/re-cert  Difficulty in walking, not elsewhere classified - Plan: PT plan of care cert/re-cert     Problem List Patient Active Problem List   Diagnosis Date Noted  . Hypertension 11/28/2018  . Uncontrolled type 2 diabetes mellitus with hyperglycemia ([place]) 11/28/2018  . Hypertriglyceridemia 11/28/2018  . Atypical chest pain 01/10/2014  . Chest pain 01/10/2014   Ruben Im, PT 11/03/19 7:12 PM Phone: [phone] Fax: [phone] Alvera Singh 11/03/2019, 7:11 PM  [facility] [address], [place], [place], [address] Phone: [phone]   Fax:  [phone]  Name: Dylan Grant MRN: [id number] Date of Birth: 1950/11/13

## 2019-11-05 ENCOUNTER — Other Ambulatory Visit: Payer: Self-pay

## 2019-11-05 ENCOUNTER — Ambulatory Visit: Payer: Medicare HMO

## 2019-11-05 DIAGNOSIS — R262 Difficulty in walking, not elsewhere classified: Secondary | ICD-10-CM

## 2019-11-05 DIAGNOSIS — M542 Cervicalgia: Secondary | ICD-10-CM | POA: Diagnosis not present

## 2019-11-05 DIAGNOSIS — G8929 Other chronic pain: Secondary | ICD-10-CM | POA: Diagnosis not present

## 2019-11-05 DIAGNOSIS — M545 Low back pain, unspecified: Secondary | ICD-10-CM

## 2019-11-05 DIAGNOSIS — M6281 Muscle weakness (generalized): Secondary | ICD-10-CM | POA: Diagnosis not present

## 2019-11-05 NOTE — Therapy (Signed)
[facility] [address], [place], [place], [address] Phone: [phone]   Fax:  [phone]  Physical Therapy Treatment  Patient Details  Name: Dylan Grant MRN: [id number] Date of Birth: 18-Nov-1950 Referring Provider (PT): Dr. Ninfa Linden    Encounter Date: 11/05/2019  PT End of Session - 11/05/19 1145    Visit Number  2    Date for PT Re-Evaluation  12/29/19    Authorization Type  [employer] Medicare 10th visit progress note    PT Start Time  1100    PT Stop Time  1144    PT Time Calculation (min)  44 min    Activity Tolerance  Patient tolerated treatment well    Behavior During Therapy  [facility] for tasks assessed/performed       Past Medical History:  Diagnosis Date  . Allergy   . Cancer ([place]) 2010   kidney  . Chronic kidney disease    kidney disease  . Diabetes mellitus without complication (HCC)    prediabetes  . GERD (gastroesophageal reflux disease)   . Hyperlipidemia   . Hypertension     Past Surgical History:  Procedure Laterality Date  . ANAL FISSURE REPAIR    . CATARACT EXTRACTION Bilateral 2012  . NEPHRECTOMY Left 2010   kidney cancer    There were no vitals filed for this visit.  Subjective Assessment - 11/05/19 1059    Subjective  I am feeling pretty good. I haven't tried walking yet.    Currently in Pain?  No/denies                       [facility] Adult PT Treatment/Exercise - 11/05/19 0001      Exercises   Exercises  Lumbar;Knee/Hip      Lumbar Exercises: Stretches   Active Hamstring Stretch  Left;Right;2 reps;20 seconds    Single Knee to Chest Stretch  2 reps;20 seconds   upper trap spasm on the Lt   Lower Trunk Rotation  3 reps;20 seconds      Lumbar Exercises: Aerobic   Nustep  Level 2x 8 minutes    PT present to discuss status     Knee/Hip Exercises: Prone   Other Prone Exercises  Seated: scapular retraction, postural education, cervical sidebending and rotation              PT Education - 11/05/19 1132    Education Details  posture education, HEP Access code: [email or web address], walking program    Person(s) Educated  Patient    Methods  Explanation;Demonstration;Handout    Comprehension  Verbalized understanding;Returned demonstration       PT Short Term Goals - 11/03/19 1859      PT SHORT TERM GOAL #1   Title  The patient will demonstrate compliance with initial HEP and postural correction strategies    Time  4    Period  Weeks    Target Date  12/01/19      PT SHORT TERM GOAL #2   Title  The patient will be able to walk 3/4 mile with pain level during and after no more than 5/10    Time  4    Period  Weeks    Status  New      PT SHORT TERM GOAL #3   Title  The patient will have improved cervical extension to 50 degrees and lumbar extension to 15 degrees needed for more upright posture when standing  Time  4    Period  Weeks    Status  New      PT SHORT TERM GOAL #4   Title  The patient will have improved lumbo/pelvic/hip core strength to grossly 4/5 needed for standing > 10 minutes for meal prep and washing dishes    Time  4    Period  Weeks    Status  New        PT Long Term Goals - 11/03/19 1903      PT LONG TERM GOAL #1   Title  The patient will be independent with safe self progression of HEP    Time  8    Period  Weeks    Status  New    Target Date  12/29/19      PT LONG TERM GOAL #2   Title  The patient will report 4/10 pain with walking > 1 mile    Time  8    Period  Weeks    Status  New      PT LONG TERM GOAL #3   Title  Improved bil HS lengths to > 70 degrees and hip extension > 12 degrees needed for walking longer periods of time    Time  8    Period  Weeks    Status  New      PT LONG TERM GOAL #4   Title  Cervical extensors and lumbo/pelvic/hip core strength grossly 4+/5 needed for standing and walking longer periods of time    Time  8    Period  Weeks    Status  New      PT LONG TERM GOAL #5    Title  FOTO functional outcome score improved from 44% limitation to 32%    Time  8    Period  Weeks    Status  New            Plan - 11/05/19 1137    Clinical Impression Statement  Pt with first time follow-up after evaluation.  PT initiated a HEP for flexibility and postural strength.  PT educated pt on safe progression of a walking program and postural corrections.  Pt required frequent tactile and verbal cues to reduce substitution and to promote good alignment.  Pt will continue to benefit from skilled PT to address lumbar pain, hip and lumbar stiffness/weakness and cervical mobility and alignment.    PT Frequency  2x / week    PT Duration  8 weeks    PT Treatment/Interventions  ADLs/Self Care Home Management;Electrical Stimulation;Cryotherapy;Ultrasound;Traction;Moist Heat;Therapeutic activities;Therapeutic exercise;Neuromuscular re-education;Manual techniques;Patient/family education;Dry needling;Taping    PT Next Visit Plan  review HEP, discuss posture again, hip flexor stretch    PT Home Exercise Plan  Access code: [email or web address]    Consulted and Agree with Plan of Care  Patient       Patient will benefit from skilled therapeutic intervention in order to improve the following deficits and impairments:  Decreased range of motion, Difficulty walking, Pain, Decreased activity tolerance, Impaired perceived functional ability, Impaired flexibility, Decreased strength, Postural dysfunction  Visit Diagnosis: Muscle weakness (generalized)  Chronic low back pain, unspecified back pain laterality, unspecified whether sciatica present  Cervicalgia  Difficulty in walking, not elsewhere classified     Problem List Patient Active Problem List   Diagnosis Date Noted  . Hypertension 11/28/2018  . Uncontrolled type 2 diabetes mellitus with hyperglycemia ([place]) 11/28/2018  . Hypertriglyceridemia 11/28/2018  . Atypical chest pain 01/10/2014  .  Chest pain 01/10/2014    Sigurd Sos,  PT 11/05/19 11:52 AM  [facility] [address] [place], [place], [address] Phone: [phone]   Fax:  [phone]  Name: Dylan Grant MRN: [id number] Date of Birth: 06/27/1951

## 2019-11-05 NOTE — Patient Instructions (Signed)
WALKING  Walking is a great form of exercise to increase your strength, endurance and overall fitness.  A walking program can help you start slowly and gradually build endurance as you go.  Everyone's ability is different, so each person's starting point will be different.  You do not have to follow them exactly.  The are just samples. You should simply find out what's right for you and stick to that program.   In the beginning, you'll start off walking 2-3 times a day for short distances.  As you get stronger, you'll be walking further at just 1-2 times per day.  A. You Can Walk For A Certain Length Of Time Each Day    Walk 5 minutes 3 times per day.  Increase 2 minutes every 2 days (3 times per day).  Work up to 25-30 minutes (1-2 times per day).   Example:   Day 1-2 5 minutes 3 times per day   Day 7-8 12 minutes 2-3 times per day   Day 13-14 25 minutes 1-2 times per day  B. You Can Walk For a Certain Distance Each Day     Distance can be substituted for time.    Example:   3 trips to mailbox (at road)   3 trips to corner of block   3 trips around the block    Please only do the exercises that your therapist has initialed and dated  [facility] [address] [place], [place] [address] Phone # [phone] Fax [phone]

## 2019-11-12 ENCOUNTER — Encounter: Payer: Medicare HMO | Admitting: Physical Therapy

## 2019-11-17 ENCOUNTER — Ambulatory Visit: Payer: Medicare HMO

## 2019-11-17 ENCOUNTER — Other Ambulatory Visit: Payer: Self-pay

## 2019-11-17 DIAGNOSIS — M542 Cervicalgia: Secondary | ICD-10-CM

## 2019-11-17 DIAGNOSIS — M545 Low back pain, unspecified: Secondary | ICD-10-CM

## 2019-11-17 DIAGNOSIS — R262 Difficulty in walking, not elsewhere classified: Secondary | ICD-10-CM

## 2019-11-17 DIAGNOSIS — G8929 Other chronic pain: Secondary | ICD-10-CM

## 2019-11-17 DIAGNOSIS — M6281 Muscle weakness (generalized): Secondary | ICD-10-CM

## 2019-11-17 NOTE — Patient Instructions (Addendum)
Decompression Exercise: Basic   Lie on back on firm surface, knees bent, feet flat, arms turned up, out to sides, backs of hands down. Time 3-5___ minutes. Surface: floor   Copyright  VHI. All rights reserved.  Shoulder Press   Press both shoulders down. Hold _3-5__ seconds. Repeat _5-10__ times. Surface: floor  Access Code: [email or web address]  URL: [email or web address]  Date: 11/17/2019  Prepared by: Sigurd Sos    Seated Cervical Retraction - 10 reps - 1 sets - 5 hold - 2x daily - 7x weekly Supine Chin Tuck - 10 reps - 3 sets - 1x daily - 7x weekly Seated Piriformis Stretch with Trunk Bend - 3 reps - 1 sets - 20 hold - 3x daily - 7x weekly  [facility] [address], [place], [place] [address] Phone # [phone] Fax [phone]

## 2019-11-17 NOTE — Therapy (Addendum)
[facility] [address] [place], [place], [address] Phone: [phone]   Fax:  [phone]  Physical Therapy Treatment  Patient Details  Name: Dylan Grant MRN: [id number] Date of Birth: 1951/03/23 Referring Provider (PT): Dr. Ninfa Linden    Encounter Date: 11/17/2019  PT End of Session - 11/17/19 0847    Visit Number  3    Date for PT Re-Evaluation  12/29/19    Authorization Type  [employer] 10th visit progress note    PT Start Time  0801    PT Stop Time  0846    PT Time Calculation (min)  45 min    Activity Tolerance  Patient tolerated treatment well    Behavior During Therapy  [facility] for tasks assessed/performed       Past Medical History:  Diagnosis Date  . Allergy   . Cancer ([place]) 2010   kidney  . Chronic kidney disease    kidney disease  . Diabetes mellitus without complication (HCC)    prediabetes  . GERD (gastroesophageal reflux disease)   . Hyperlipidemia   . Hypertension     Past Surgical History:  Procedure Laterality Date  . ANAL FISSURE REPAIR    . CATARACT EXTRACTION Bilateral 2012  . NEPHRECTOMY Left 2010   kidney cancer    There were no vitals filed for this visit.  Subjective Assessment - 11/17/19 0804    Subjective  I am having neck/shoulder pain with exercises where I have to pull my leg to stretch.  I was able to walk 1 mile one day and 1/2 mile the other day.  I am not as tight at night.    Patient Stated Goals  I want to be able to do regular exercise; make some improvement in posture; my wife wants me to not lean to the side    Currently in Pain?  No/denies                       [facility] Adult PT Treatment/Exercise - 11/17/19 0001      Lumbar Exercises: Stretches   Active Hamstring Stretch  Left;Right;2 reps;20 seconds    Lower Trunk Rotation  3 reps;20 seconds    Piriformis Stretch  3 reps;20 seconds      Lumbar Exercises: Aerobic   Nustep  Level 2x 8  minutes    PT present to discuss status     Lumbar Exercises: Supine   Other Supine Lumbar Exercises  Supine:  decompression with 2 pillows, chin tuck and scapular press x 10 each      Knee/Hip Exercises: Prone   Other Prone Exercises  Seated: scapular retraction, postural education, cervical sidebending and rotation             PT Education - 11/17/19 0838    Education Details  Access Code: [email or web address], scapular press in supine    Person(s) Educated  Patient    Methods  Explanation;Demonstration;Handout    Comprehension  Verbalized understanding;Returned demonstration       PT Short Term Goals - 11/17/19 0809      PT SHORT TERM GOAL #1   Title  The patient will demonstrate compliance with initial HEP and postural correction strategies    Status  Achieved      PT SHORT TERM GOAL #2   Title  The patient will be able to walk 3/4 mile with pain level during and after no more than 5/10  Baseline  pt was able to do this 1x    Time  4    Period  Weeks    Status  On-going        PT Long Term Goals - 11/03/19 1903      PT LONG TERM GOAL #1   Title  The patient will be independent with safe self progression of HEP    Time  8    Period  Weeks    Status  New    Target Date  12/29/19      PT LONG TERM GOAL #2   Title  The patient will report 4/10 pain with walking > 1 mile    Time  8    Period  Weeks    Status  New      PT LONG TERM GOAL #3   Title  Improved bil HS lengths to > 70 degrees and hip extension > 12 degrees needed for walking longer periods of time    Time  8    Period  Weeks    Status  New      PT LONG TERM GOAL #4   Title  Cervical extensors and lumbo/pelvic/hip core strength grossly 4+/5 needed for standing and walking longer periods of time    Time  8    Period  Weeks    Status  New      PT LONG TERM GOAL #5   Title  FOTO functional outcome score improved from 44% limitation to 32%    Time  8    Period  Weeks    Status  New             Plan - 11/17/19 [id number]    Clinical Impression Statement  Pt has been able to walk 0.5 to 1 mile a couple of times since last session.  Pt is having difficulty with supine exercises due to neck/upper trap pain.  PT provided instruction and modifications to reduce pain with these exercises.  Pt required tactile and verbal cues to reduce scapular elevation with cervical stretches and cervical retraction and for proper alignment.  Pt reports improved sleep and is no longer waking at night with stiffness.  Pt required max tactile and demo cues for hamstring and piriformis stretches today.  PT encouraged pt to stretch prior to walking.  Pt worked on chin retractions to improve postural alignment and allow for supine position without pain.  Pt will continue to benefit from skilled PT to address postural alignment, strength and flexibility to allow for return to prior level of function.    PT Frequency  2x / week    PT Duration  8 weeks    PT Treatment/Interventions  ADLs/Self Care Home Management;Electrical Stimulation;Cryotherapy;Ultrasound;Traction;Moist Heat;Therapeutic activities;Therapeutic exercise;Neuromuscular re-education;Manual techniques;Patient/family education;Dry needling;Taping    PT Next Visit Plan  review HEP, reveiw cervical retraction, adress modificaitons for HEP.  consider dry needling to Lt upper trap.    PT Home Exercise Plan  Access code: [email or web address]    Consulted and Agree with Plan of Care  Patient       Patient will benefit from skilled therapeutic intervention in order to improve the following deficits and impairments:  Decreased range of motion, Difficulty walking, Pain, Decreased activity tolerance, Impaired perceived functional ability, Impaired flexibility, Decreased strength, Postural dysfunction  Visit Diagnosis: Muscle weakness (generalized)  Chronic low back pain, unspecified back pain laterality, unspecified whether sciatica present  Cervicalgia  Difficulty  in walking, not elsewhere classified  Problem List Patient Active Problem List   Diagnosis Date Noted  . Hypertension 11/28/2018  . Uncontrolled type 2 diabetes mellitus with hyperglycemia ([place]) 11/28/2018  . Hypertriglyceridemia 11/28/2018  . Atypical chest pain 01/10/2014  . Chest pain 01/10/2014     Sigurd Sos, PT 11/17/19 9:01 AM  [facility] [address], [place], [place], [address] Phone: [phone]   Fax:  [phone]  Name: VISHNU JESBERGER MRN: [id number] Date of Birth: May 16, 1951

## 2019-11-19 ENCOUNTER — Other Ambulatory Visit: Payer: Self-pay

## 2019-11-19 ENCOUNTER — Ambulatory Visit: Payer: Medicare HMO | Admitting: Physical Therapy

## 2019-11-19 DIAGNOSIS — M6281 Muscle weakness (generalized): Secondary | ICD-10-CM | POA: Diagnosis not present

## 2019-11-19 DIAGNOSIS — G8929 Other chronic pain: Secondary | ICD-10-CM | POA: Diagnosis not present

## 2019-11-19 DIAGNOSIS — R262 Difficulty in walking, not elsewhere classified: Secondary | ICD-10-CM | POA: Diagnosis not present

## 2019-11-19 DIAGNOSIS — M542 Cervicalgia: Secondary | ICD-10-CM | POA: Diagnosis not present

## 2019-11-19 DIAGNOSIS — M545 Low back pain: Secondary | ICD-10-CM | POA: Diagnosis not present

## 2019-11-19 NOTE — Patient Instructions (Signed)
Access Code: [email or web address]  URL: [email or web address]  Date: 11/19/2019  Prepared by: Ruben Im   Exercises Supine Lower Trunk Rotation - 3 reps - 1 sets - 20 hold - 3x daily - 7x weekly Seated Hamstring Stretch - 3 reps - 1 sets - 20 hold - 3x daily - 7x weekly Standing Cervical Sidebending AROM - 10 reps - 20 hold - 3x daily - 7x weekly Neck Rotation - 3 reps - 1 sets - 20 hold - 3x daily - 7x weekly Seated Scapular Retraction - 10 reps - 5 hold - 3x daily - 7x weekly Supine Lower Trunk Rotation - 3 reps - 1 sets - 20 hold - 2x daily - 7x weekly Seated Correct Posture - 10 reps - 3 sets - 1x daily - 7x weekly Clamshell - 10 reps - 2 sets - 3x daily - 7x weekly Seated Cervical Retraction - 10 reps - 1 sets - 5 hold - 2x daily - 7x weekly Supine Chin Tuck - 10 reps - 3 sets - 1x daily - 7x weekly Seated Piriformis Stretch with Trunk Bend - 3 reps - 1 sets - 20 hold - 3x daily - 7x weekly Supine Shoulder Horizontal Abduction with Resistance - 10 reps - 1 sets - 1x daily - 7x weekly Supine Shoulder External Rotation with Resistance - 10 reps - 1 sets - 1x daily - 7x weekly Seated Shoulder Diagonal with Resistance - 10 reps - 1 sets - 1x daily - 7x weekly   Trigger Point Dry Needling  . What is Trigger Point Dry Needling (DN)? o DN is a physical therapy technique used to treat muscle pain and dysfunction. Specifically, DN helps deactivate muscle trigger points (muscle knots).  o A thin filiform needle is used to penetrate the skin and stimulate the underlying trigger point. The goal is for a local twitch response (LTR) to occur and for the trigger point to relax. No medication of any kind is injected during the procedure.   . What Does Trigger Point Dry Needling Feel Like?  o The procedure feels different for each individual patient. Some patients report that they do not actually feel the needle enter the skin and overall the process is not painful. Very mild bleeding may  occur. However, many patients feel a deep cramping in the muscle in which the needle was inserted. This is the local twitch response.   Marland Kitchen How Will I feel after the treatment? o Soreness is normal, and the onset of soreness may not occur for a few hours. Typically this soreness does not last longer than two days.  o Bruising is uncommon, however; ice can be used to decrease any possible bruising.  o In rare cases feeling tired or nauseous after the treatment is normal. In addition, your symptoms may get worse before they get better, this period will typically not last longer than 24 hours.   . What Can I do After My Treatment? o Increase your hydration by drinking more water for the next 24 hours. o You may place ice or heat on the areas treated that have become sore, however, do not use heat on inflamed or bruised areas. Heat often brings more relief post needling. o You can continue your regular activities, but vigorous activity is not recommended initially after the treatment for 24 hours. o DN is best combined with other physical therapy such as strengthening, stretching, and other therapies.    TENS UNIT  This is helpful for muscle pain and  spasm.   Search and Purchase a TENS 7000 2nd edition at www.tenspros.com or www.amazon.com  (It should be less than $30)     TENS unit instructions:   Do not shower or bathe with the unit on  Turn the unit off before removing electrodes or batteries  If the electrodes lose stickiness add a drop of water to the electrodes after they are disconnected from the unit and place on plastic sheet. If you continued to have difficulty, call the TENS unit company to purchase more electrodes.  Do not apply lotion on the skin area prior to use. Make sure the skin is clean and dry as this will help prolong the life of the electrodes.  After use, always check skin for unusual red areas, rash or other skin difficulties. If there are any skin problems, does not  apply electrodes to the same area.  Never remove the electrodes from the unit by pulling the wires.  Do not use the TENS unit or electrodes other than as directed.  Do not change electrode placement without consulting your therapist or physician.  Keep 2 fingers with between each electrode.    TENS stands for Transcutaneous Electrical Nerve Stimulation. In other words, electrical impulses are allowed to pass through the skin in order to excite a nerve.   Purpose and Use of TENS:  TENS is a method used to manage acute and chronic pain without the use of drugs. It has been effective in managing pain associated with surgery, sprains, strains, trauma, rheumatoid arthritis, and neuralgias. It is a non-addictive, low risk, and non-invasive technique used to control pain. It is not, by any means, a curative form of treatment.   How TENS Works:  Most TENS units are a Paramedic unit powered by one 9 volt battery. Attached to the outside of the unit are two lead wires where two pins and/or snaps connect on each wire. All units come with a set of four reusable pads or electrodes. These are placed on the skin surrounding the area involved. By inserting the leads into  the pads, the electricity can pass from the unit making the circuit complete.  As the intensity is turned up slowly, the electrical current enters the body from the electrodes through the skin to the surrounding nerve fibers. This triggers the release of hormones from within the body. These hormones contain pain relievers. By increasing the circulation of these hormones, the person's pain may be lessened. It is also believed that the electrical stimulation itself helps to block the pain messages being sent to the brain, thus also decreasing the body's perception of pain.   Hazards:  TENS units are NOT to be used by patients with PACEMAKERS, DEFIBRILLATORS, DIABETIC PUMPS, PREGNANT WOMEN, and patients with SEIZURE DISORDERS.  TENS  units are NOT to be used over the heart, throat, brain, or spinal cord.  One of the major side effects from the TENS unit may be skin irritation. Some people may develop a rash if they are sensitive to the materials used in the electrodes or the connecting wires.     Ruben Im PT [facility] [address] [place], [place] [address] Phone # [phone] Fax [phone]  Avoid overuse due the body getting used to the stem making it not as effective over time.

## 2019-11-19 NOTE — Therapy (Signed)
[facility] [address] [place], [place], [address] Phone: [phone]   Fax:  [phone]  Physical Therapy Treatment  Patient Details  Name: Dylan Grant MRN: [id number] Date of Birth: 06-06-1951 Referring Provider (PT): Dr. Ninfa Linden    Encounter Date: 11/19/2019  PT End of Session - 11/19/19 0854    Visit Number  4    Date for PT Re-Evaluation  12/29/19    Authorization Type  [employer] Medicare 10th visit progress note    PT Start Time  0801    PT Stop Time  0850    PT Time Calculation (min)  49 min    Activity Tolerance  Patient tolerated treatment well       Past Medical History:  Diagnosis Date  . Allergy   . Cancer ([place]) 2010   kidney  . Chronic kidney disease    kidney disease  . Diabetes mellitus without complication (HCC)    prediabetes  . GERD (gastroesophageal reflux disease)   . Hyperlipidemia   . Hypertension     Past Surgical History:  Procedure Laterality Date  . ANAL FISSURE REPAIR    . CATARACT EXTRACTION Bilateral 2012  . NEPHRECTOMY Left 2010   kidney cancer    There were no vitals filed for this visit.  Subjective Assessment - 11/19/19 0804    Subjective  I having my 2nd covid vaccine on Monday.  Back has been more tensed up the last couple of nights.  The neck has been more problematic.  My neck can't go flat without aggraving my shoulder.    Pertinent History  Kidney Cancer (lost a kidney) other kidney is not great;  HTN; diabetes; neck issues can't lie with my head flat on the floor/ hurts at the dentist;  the medications I take make me weaker;  Kidney medicine might affect calcium absorption but I've never had bone density test.    Currently in Pain?  Yes    Pain Score  1     Pain Location  Neck    Multiple Pain Sites  No                       [facility] Adult PT Treatment/Exercise - 11/19/19 0001      Neck Exercises: Supine   Neck Retraction  10 reps   2  pillows    Other Supine Exercise  red band narrow and wide grip overhead with red band 5x each     Other Supine Exercise  horizontal abduction red band and diagonals, external rotation  5x each       Lumbar Exercises: Stretches   Active Hamstring Stretch  Left;Right;2 reps;20 seconds    Single Knee to Chest Stretch  2 reps;20 seconds   upper trap spasm on the Lt   Lower Trunk Rotation  3 reps;20 seconds      Moist Heat Therapy   Number Minutes Moist Heat  5 Minutes    Moist Heat Location  Cervical      Manual Therapy   Manual Therapy  Soft tissue mobilization    Soft tissue mobilization  bil cervical paraspinals, upper traps, levators        Trigger Point Dry Needling - 11/19/19 0001    Consent Given?  Yes    Muscles Treated Head and Neck  Upper trapezius;Cervical multifidi    Dry Needling Comments  bil    Upper Trapezius Response  Twitch reponse elicited;Palpable increased  muscle length    Cervical multifidi Response  Palpable increased muscle length           PT Education - 11/19/19 0851    Education Details  Access Code: [email or web address]  supine red band ex's;  TENS info;  DN after care    Person(s) Educated  Patient    Methods  Explanation;Demonstration;Handout    Comprehension  Returned demonstration;Verbalized understanding       PT Short Term Goals - 11/17/19 0809      PT SHORT TERM GOAL #1   Title  The patient will demonstrate compliance with initial HEP and postural correction strategies    Status  Achieved      PT SHORT TERM GOAL #2   Title  The patient will be able to walk 3/4 mile with pain level during and after no more than 5/10    Baseline  pt was able to do this 1x    Time  4    Period  Weeks    Status  On-going        PT Long Term Goals - 11/03/19 1903      PT LONG TERM GOAL #1   Title  The patient will be independent with safe self progression of HEP    Time  8    Period  Weeks    Status  New    Target Date  12/29/19      PT LONG TERM GOAL  #2   Title  The patient will report 4/10 pain with walking > 1 mile    Time  8    Period  Weeks    Status  New      PT LONG TERM GOAL #3   Title  Improved bil HS lengths to > 70 degrees and hip extension > 12 degrees needed for walking longer periods of time    Time  8    Period  Weeks    Status  New      PT LONG TERM GOAL #4   Title  Cervical extensors and lumbo/pelvic/hip core strength grossly 4+/5 needed for standing and walking longer periods of time    Time  8    Period  Weeks    Status  New      PT LONG TERM GOAL #5   Title  FOTO functional outcome score improved from 44% limitation to 32%    Time  8    Period  Weeks    Status  New            Plan - 11/19/19 [id number]    Clinical Impression Statement  The patient's primary complaint recently is neck more than low back.   He is able to initiate neck and postural strengthening in supine without pain exacerbation while accommodating his head forward position with 2 pillows.  He has tender points in bilateral upper traps and cervical paraspinals and he is very receptive to trying dry needling along with manual therapy.  Following this, much improved soft tissue mobility noted.  He expresses interest in a home TENs and we discussed his options.  Therapist providing verbal and tactile cues for exercise technique and and monitoring response with all treatment interventions.    Comorbidities  chronic history of spinal pain;  kidney disease; HTN; diabetes    Rehab Potential  Good    PT Frequency  2x / week    PT Duration  8 weeks    PT Treatment/Interventions  ADLs/Self Care  Home Management;Electrical Stimulation;Cryotherapy;Ultrasound;Traction;Moist Heat;Therapeutic activities;Therapeutic exercise;Neuromuscular re-education;Manual techniques;Patient/family education;Dry needling;Taping    PT Next Visit Plan  review HEP, reveiw cervical retraction, adress modificaitons for HEP. assess response to  dry needling to Lt upper trap.    PT  Home Exercise Plan  Access code: [email or web address]       Patient will benefit from skilled therapeutic intervention in order to improve the following deficits and impairments:  Decreased range of motion, Difficulty walking, Pain, Decreased activity tolerance, Impaired perceived functional ability, Impaired flexibility, Decreased strength, Postural dysfunction  Visit Diagnosis: Muscle weakness (generalized)  Chronic low back pain, unspecified back pain laterality, unspecified whether sciatica present  Cervicalgia  Difficulty in walking, not elsewhere classified     Problem List Patient Active Problem List   Diagnosis Date Noted  . Hypertension 11/28/2018  . Uncontrolled type 2 diabetes mellitus with hyperglycemia ([place]) 11/28/2018  . Hypertriglyceridemia 11/28/2018  . Atypical chest pain 01/10/2014  . Chest pain 01/10/2014   Ruben Im, PT 11/19/19 5:42 PM Phone: [phone] Fax: [phone] Alvera Singh 11/19/2019, 5:41 PM  [facility] [address], [place], [place], [address] Phone: [phone]   Fax:  [phone]  Name: Dylan Grant MRN: [id number] Date of Birth: 1951-01-31

## 2019-11-23 ENCOUNTER — Ambulatory Visit: Payer: Medicare HMO | Admitting: Orthopaedic Surgery

## 2019-11-23 ENCOUNTER — Other Ambulatory Visit: Payer: Self-pay

## 2019-11-23 ENCOUNTER — Ambulatory Visit: Payer: Medicare HMO | Attending: Internal Medicine

## 2019-11-23 ENCOUNTER — Encounter: Payer: Self-pay | Admitting: Orthopaedic Surgery

## 2019-11-23 DIAGNOSIS — Z23 Encounter for immunization: Secondary | ICD-10-CM | POA: Insufficient documentation

## 2019-11-23 DIAGNOSIS — M25552 Pain in left hip: Secondary | ICD-10-CM | POA: Diagnosis not present

## 2019-11-23 DIAGNOSIS — M545 Low back pain, unspecified: Secondary | ICD-10-CM

## 2019-11-23 NOTE — Progress Notes (Signed)
   Covid-19 Vaccination Clinic  Name:  Dylan Grant    MRN: [id number] DOB: 1951/04/05  11/23/2019  Mr. Dylan Grant was observed post Covid-19 immunization for 15 minutes without incidence. He was provided with Vaccine Information Sheet and instruction to access the V-Safe system.   Mr. Dylan Grant was instructed to call 911 with any severe reactions post vaccine: Marland Kitchen Difficulty breathing  . Swelling of your face and throat  . A fast heartbeat  . A bad rash all over your body  . Dizziness and weakness    Immunizations Administered    Name Date Dose VIS Date Route   Pfizer COVID-19 Vaccine 11/23/2019  4:54 PM 0.3 mL 09/04/2019 Intramuscular   Manufacturer: [place]   Lot: [id number]   [place]: [id number]

## 2019-11-23 NOTE — Progress Notes (Signed)
Office Visit Note   Patient: Dylan Grant           Date of Birth: 1951-06-13           MRN: [id number] Visit Date: 11/23/2019              Requested by: Josetta Huddle, MD [address] [place],  [place] [address] PCP: Josetta Huddle, MD   Assessment & Plan: Visit Diagnoses:  1. Pain in left hip   2. Low back pain, unspecified back pain laterality, unspecified chronicity, unspecified whether sciatica present     Plan: He will continue physical therapy for his back and neck.  We will see him back after the hip injection to see what type of response he had to the injection.  He should follow-up with [place] approximately 2 weeks after the injection.  Discussed with him that the injection is both for therapeutic and diagnostic purposes.  He still may need benefit from a left hip MRI to evaluate his cartilage given his well-preserved hips on plain radiographs.  Questions encouraged and answered by Dr. Ninfa Linden and  myself.  Follow-Up Instructions: Return Approximately 2 weeks after hip injection.   Orders:  No orders of the defined types were placed in this encounter.  No orders of the defined types were placed in this encounter.     Procedures: No procedures performed   Clinical Data: No additional findings.   Subjective: Chief Complaint  Patient presents with  . Left Hip - Follow-up  . Lower Back - Follow-up    HPI [name] comes in today for follow-up low back pain left hip pain.  He also was having some neck issues at that time.  Physical therapy therapy has helped with his back he feels his back is more relaxed.  Also feels that his neck and shoulder are somewhat improved.  Occasionally still has some numbness in the left hand but this is not severe.  His main complaint is the fact that he is unable to exercise due to anterior left hip pain.  No radicular symptoms down the leg.  Pain has some difficulty sleeping due to the hip pain.  Did not undergo an  intra-articular injection of the left hip due to the fact that he had a Covid vaccine was wanting to wait 14 days from the last injection before having the intra-articular injection.  Review of Systems Negative for fevers or chills. See HPI.   Objective: Vital Signs: There were no vitals taken for this visit.  Physical Exam Constitutional:      Appearance: He is not ill-appearing or diaphoretic.  Pulmonary:     Effort: Pulmonary effort is normal.  Neurological:     Mental Status: He is alert and oriented to person, place, and time.  Psychiatric:        Mood and Affect: Mood normal.     Ortho Exam Bilateral hips: Good range of motion of the right hip without pain.  Left hip slight decreased internal rotation compared to the right hip.  No pain with range of motion however.  Tenderness over the left trochanteric region.  Negative straight leg raise bilaterally.  Ambulates without any assistive device.  Specialty Comments:  No specialty comments available.  Imaging: No results found.   PMFS History: Patient Active Problem List   Diagnosis Date Noted  . Hypertension 11/28/2018  . Uncontrolled type 2 diabetes mellitus with hyperglycemia ([place]) 11/28/2018  . Hypertriglyceridemia 11/28/2018  . Atypical  chest pain 01/10/2014  . Chest pain 01/10/2014   Past Medical History:  Diagnosis Date  . Allergy   . Cancer ([place]) 2010   kidney  . Chronic kidney disease    kidney disease  . Diabetes mellitus without complication (HCC)    prediabetes  . GERD (gastroesophageal reflux disease)   . Hyperlipidemia   . Hypertension     Family History  Problem Relation Age of Onset  . Hypertension Mother   . Dementia Mother   . Heart attack Father   . Hyperlipidemia Brother   . Hypertension Brother   . Colon cancer Neg Hx     Past Surgical History:  Procedure Laterality Date  . ANAL FISSURE REPAIR    . CATARACT EXTRACTION Bilateral 2012  . NEPHRECTOMY Left 2010   kidney cancer    Social History   Occupational History  . Not on file  Tobacco Use  . Smoking status: Never Smoker  . Smokeless tobacco: Never Used  Substance and Sexual Activity  . Alcohol use: No    Alcohol/week: 0.0 standard drinks  . Drug use: No  . Sexual activity: Not on file

## 2019-11-26 ENCOUNTER — Other Ambulatory Visit: Payer: Self-pay

## 2019-11-26 ENCOUNTER — Ambulatory Visit: Payer: Medicare HMO | Attending: Orthopaedic Surgery | Admitting: Physical Therapy

## 2019-11-26 DIAGNOSIS — G8929 Other chronic pain: Secondary | ICD-10-CM | POA: Diagnosis not present

## 2019-11-26 DIAGNOSIS — M542 Cervicalgia: Secondary | ICD-10-CM | POA: Insufficient documentation

## 2019-11-26 DIAGNOSIS — M6281 Muscle weakness (generalized): Secondary | ICD-10-CM

## 2019-11-26 DIAGNOSIS — M545 Low back pain, unspecified: Secondary | ICD-10-CM

## 2019-11-26 DIAGNOSIS — R262 Difficulty in walking, not elsewhere classified: Secondary | ICD-10-CM | POA: Diagnosis not present

## 2019-11-26 NOTE — Therapy (Signed)
[facility] [address] [place], [place], [address] Phone: [phone]   Fax:  [phone]  Physical Therapy Treatment  Patient Details  Name: Dylan Grant MRN: [id number] Date of Birth: [birth year] Referring Provider (PT): Dr. Ninfa Linden    Encounter Date: 11/26/2019  PT End of Session - 11/26/19 2059    Visit Number  5    Date for PT Re-Evaluation  12/29/19    Authorization Type  [employer] Medicare 10th visit progress note    PT Start Time  0845    PT Stop Time  0930    PT Time Calculation (min)  45 min    Activity Tolerance  Patient tolerated treatment well       Past Medical History:  Diagnosis Date  . Allergy   . Cancer ([place]) 2010   kidney  . Chronic kidney disease    kidney disease  . Diabetes mellitus without complication (HCC)    prediabetes  . GERD (gastroesophageal reflux disease)   . Hyperlipidemia   . Hypertension     Past Surgical History:  Procedure Laterality Date  . ANAL FISSURE REPAIR    . CATARACT EXTRACTION Bilateral 2012  . NEPHRECTOMY Left 2010   kidney cancer    There were no vitals filed for this visit.  Subjective Assessment - 11/26/19 0851    Subjective  26 hours after the covid vaccine felt like I had the flu.  My hip is not bothering me at night anymore.  I haven't tried walking and it hurts on the front of my thigh.  Neck and shoulder are a little better.  Got home TENS but haven't used yet.    Pertinent History  Kidney Cancer (lost a kidney) other kidney is not great;  HTN; diabetes; neck issues can't lie with my head flat on the floor/ hurts at the dentist;  the medications I take make me weaker;  Kidney medicine might affect calcium absorption but I've never had bone density test.         [facility] PT Assessment - 11/26/19 0001      AROM   Cervical Flexion  45    Cervical Extension  56    Cervical - Right Side Bend  48    Cervical - Left Side Bend  40    Cervical - Right  Rotation  45    Cervical - Left Rotation  55    Lumbar Flexion  73    Lumbar Extension  13    Lumbar - Right Side Bend  34    Lumbar - Left Side Bend  35                   [facility] Adult PT Treatment/Exercise - 11/26/19 0001      Neck Exercises: Supine   Neck Retraction  10 reps   1 pillow      Lumbar Exercises: Stretches   Single Knee to Chest Stretch  2 reps;20 seconds      Lower Trunk Rotation  3 reps;20 seconds    Quad Stretch Limitations  supine hip flexor stretch off side of bed 3x 30 sec       Lumbar Exercises: Standing   Other Standing Lumbar Exercises  psoas hip flexor stetch with and without UE 3x 5 right/left       Moist Heat Therapy   Number Minutes Moist Heat  5 Minutes    Moist Heat Location  Cervical  Manual Therapy   Joint Mobilization  left hip PA, PA in internal rotation and PA in external rotation 3x 30 sec;  supine long axis hip distraction 3x 30 sec     Soft tissue mobilization  bil cervical paraspinals, upper traps, levators        Trigger Point Dry Needling - 11/26/19 0001    Consent Given?  Yes    Upper Trapezius Response  Twitch reponse elicited;Palpable increased muscle length    Cervical multifidi Response  Palpable increased muscle length           PT Education - 11/26/19 2059    Education Details  Access Code: [email or web address] supine hip flexor stretch;  doorway stretch    Person(s) Educated  Patient    Methods  Explanation;Demonstration;Handout    Comprehension  Returned demonstration;Verbalized understanding       PT Short Term Goals - 11/26/19 2105      PT SHORT TERM GOAL #1   Title  The patient will demonstrate compliance with initial HEP and postural correction strategies    Status  Achieved      PT SHORT TERM GOAL #2   Title  The patient will be able to walk 3/4 mile with pain level during and after no more than 5/10    Time  4    Period  Weeks    Status  On-going      PT SHORT TERM GOAL #3   Title  The patient  will have improved cervical extension to 50 degrees and lumbar extension to 15 degrees needed for more upright posture when standing    Status  Achieved      PT SHORT TERM GOAL #4   Title  The patient will have improved lumbo/pelvic/hip core strength to grossly 4/5 needed for standing > 10 minutes for meal prep and washing dishes    Time  4    Period  Weeks    Status  On-going        PT Long Term Goals - 11/03/19 1903      PT LONG TERM GOAL #1   Title  The patient will be independent with safe self progression of HEP    Time  8    Period  Weeks    Status  New    Target Date  12/29/19      PT LONG TERM GOAL #2   Title  The patient will report 4/10 pain with walking > 1 mile    Time  8    Period  Weeks    Status  New      PT LONG TERM GOAL #3   Title  Improved bil HS lengths to > 70 degrees and hip extension > 12 degrees needed for walking longer periods of time    Time  8    Period  Weeks    Status  New      PT LONG TERM GOAL #4   Title  Cervical extensors and lumbo/pelvic/hip core strength grossly 4+/5 needed for standing and walking longer periods of time    Time  8    Period  Weeks    Status  New      PT LONG TERM GOAL #5   Title  FOTO functional outcome score improved from 44% limitation to 32%    Time  8    Period  Weeks    Status  New            Plan -  11/26/19 0929    Clinical Impression Statement  The patient demonstrates a significant improvement in cervical and lumbar ROM in most planes of motion.  He demonstrates improved postural alignment and is now able to lie supine with only 1 pillow vs. 2.  Positive response to DN of cervical region with decreased tender point size and number.  Improved left hip joint mobility, especially internal rotation, following mobilization.  Therapist monitoring response with all interventions.    Comorbidities  chronic history of spinal pain;  kidney disease; HTN; diabetes    Rehab Potential  Good    PT Frequency  2x /  week    PT Duration  8 weeks    PT Treatment/Interventions  ADLs/Self Care Home Management;Electrical Stimulation;Cryotherapy;Ultrasound;Traction;Moist Heat;Therapeutic activities;Therapeutic exercise;Neuromuscular re-education;Manual techniques;Patient/family education;Dry needling;Taping    PT Next Visit Plan  check remaining STGS;  review hip flexor stretches;  cervical retraction. assess response to  dry needling to Lt upper trap and possibly add left hip flexors, quads;  left hip mobs in prone (emphasize internal rotation)    PT Home Exercise Plan  Access code: [email or web address]       Patient will benefit from skilled therapeutic intervention in order to improve the following deficits and impairments:  Decreased range of motion, Difficulty walking, Pain, Decreased activity tolerance, Impaired perceived functional ability, Impaired flexibility, Decreased strength, Postural dysfunction  Visit Diagnosis: Muscle weakness (generalized)  Chronic low back pain, unspecified back pain laterality, unspecified whether sciatica present  Cervicalgia     Problem List Patient Active Problem List   Diagnosis Date Noted  . Hypertension 11/28/2018  . Uncontrolled type 2 diabetes mellitus with hyperglycemia ([place]) 11/28/2018  . Hypertriglyceridemia 11/28/2018  . Atypical chest pain 01/10/2014  . Chest pain 01/10/2014   Ruben Im, PT 11/26/19 9:07 PM Phone: [phone] Fax: [phone] Alvera Singh 11/26/2019, 9:07 PM  [facility] [address] [place], [place], [address] Phone: [phone]   Fax:  [phone]  Name: Dylan Grant MRN: [id number] Date of Birth: [birth year]

## 2019-11-26 NOTE — Patient Instructions (Signed)
Access Code: [email or web address]  URL: [email or web address]  Date: 11/26/2019  Prepared by: Ruben Im   Exercises Supine Lower Trunk Rotation - 3 reps - 1 sets - 20 hold - 3x daily - 7x weekly Seated Hamstring Stretch - 3 reps - 1 sets - 20 hold - 3x daily - 7x weekly Standing Cervical Sidebending AROM - 10 reps - 20 hold - 3x daily - 7x weekly Neck Rotation - 3 reps - 1 sets - 20 hold - 3x daily - 7x weekly Seated Scapular Retraction - 10 reps - 5 hold - 3x daily - 7x weekly Supine Lower Trunk Rotation - 3 reps - 1 sets - 20 hold - 2x daily - 7x weekly Seated Correct Posture - 10 reps - 3 sets - 1x daily - 7x weekly Clamshell - 10 reps - 2 sets - 3x daily - 7x weekly Seated Cervical Retraction - 10 reps - 1 sets - 5 hold - 2x daily - 7x weekly Supine Chin Tuck - 10 reps - 3 sets - 1x daily - 7x weekly Seated Piriformis Stretch with Trunk Bend - 3 reps - 1 sets - 20 hold - 3x daily - 7x weekly Supine Shoulder Horizontal Abduction with Resistance - 10 reps - 1 sets - 1x daily - 7x weekly Supine Shoulder External Rotation with Resistance - 10 reps - 1 sets - 1x daily - 7x weekly Seated Shoulder Diagonal with Resistance - 10 reps - 1 sets - 1x daily - 7x weekly Hip Flexor Stretch at Edge of Bed - 3 reps - 1 sets - 30 hold - 1x daily - 7x weekly doorway hip flexor stretch - 5 reps - 3 sets - 1x daily - 7x weekly

## 2019-12-01 ENCOUNTER — Ambulatory Visit: Payer: Medicare HMO | Admitting: Physical Therapy

## 2019-12-01 ENCOUNTER — Other Ambulatory Visit: Payer: Self-pay

## 2019-12-01 DIAGNOSIS — M545 Low back pain, unspecified: Secondary | ICD-10-CM

## 2019-12-01 DIAGNOSIS — M6281 Muscle weakness (generalized): Secondary | ICD-10-CM

## 2019-12-01 DIAGNOSIS — M542 Cervicalgia: Secondary | ICD-10-CM | POA: Diagnosis not present

## 2019-12-01 DIAGNOSIS — R262 Difficulty in walking, not elsewhere classified: Secondary | ICD-10-CM | POA: Diagnosis not present

## 2019-12-01 DIAGNOSIS — G8929 Other chronic pain: Secondary | ICD-10-CM | POA: Diagnosis not present

## 2019-12-01 NOTE — Therapy (Signed)
[facility] [address] [place], [place], [address] Phone: [phone]   Fax:  [phone]  Physical Therapy Treatment  Patient Details  Name: Dylan Grant MRN: [id number] Date of Birth: Oct 28, 1950 Referring Provider (PT): Dr. Ninfa Linden    Encounter Date: 12/01/2019  PT End of Session - 12/01/19 1751    Visit Number  6    Date for PT Re-Evaluation  12/29/19    Authorization Type  [employer] Medicare 10th visit progress note    PT Start Time  0801    PT Stop Time  0844    PT Time Calculation (min)  43 min    Activity Tolerance  Patient tolerated treatment well       Past Medical History:  Diagnosis Date  . Allergy   . Cancer ([place]) 2010   kidney  . Chronic kidney disease    kidney disease  . Diabetes mellitus without complication (HCC)    prediabetes  . GERD (gastroesophageal reflux disease)   . Hyperlipidemia   . Hypertension     Past Surgical History:  Procedure Laterality Date  . ANAL FISSURE REPAIR    . CATARACT EXTRACTION Bilateral 2012  . NEPHRECTOMY Left 2010   kidney cancer    There were no vitals filed for this visit.  Subjective Assessment - 12/01/19 0812    Subjective  I tore down part of a deck yesterday and my back is stiff today.   Stretches going well.    Pertinent History  Kidney Cancer (lost a kidney) other kidney is not great;  HTN; diabetes; neck issues can't lie with my head flat on the floor/ hurts at the dentist;  the medications I take make me weaker;  Kidney medicine might affect calcium absorption but I've never had bone density test.    How long can you walk comfortably?  On Saturday walked 1 mile  without pain just out of shape.    Diagnostic tests  x-rays severe DDD at multi levels and large osteophytes "significant arthritic changes";  no concerns about the hip    Currently in Pain?  No/denies    Pain Score  0-No pain                       [facility] Adult PT  Treatment/Exercise - 12/01/19 0001      Neck Exercises: Supine   Neck Retraction  10 reps   1 pillow    Other Supine Exercise  supine lying on foam roll vertically palms up 2 min   towel roll to accomodate head/neck    Other Supine Exercise  thoracic extension with foam roll 10x       Lumbar Exercises: Stretches   Active Hamstring Stretch  Left;Right;2 reps;20 seconds    Single Knee to Chest Stretch  2 reps;20 seconds   upper trap spasm on the Lt   Hip Flexor Stretch  Left;3 reps;30 seconds    Hip Flexor Stretch Limitations  off side of the bed       Lumbar Exercises: Sidelying   Other Sidelying Lumbar Exercises  open books with leg resting on foam roll 10x       Lumbar Exercises: Quadruped   Other Quadruped Lumbar Exercises  thread the needle with foam roll 10x right/left     Other Quadruped Lumbar Exercises  childs pose with foam roll 10x       Manual Therapy   Joint Mobilization  left hip PA,  PA in internal rotation and PA in external rotation 3x 30 sec;  supine long axis hip distraction 3x 30 sec              PT Education - 12/01/19 1751    Education Details  info on where to purchase foam roll per patient request    Person(s) Educated  Patient    Methods  Handout    Comprehension  Verbalized understanding       PT Short Term Goals - 12/01/19 0816      PT SHORT TERM GOAL #1   Title  The patient will demonstrate compliance with initial HEP and postural correction strategies    Status  Achieved      PT SHORT TERM GOAL #2   Title  The patient will be able to walk 3/4 mile with pain level during and after no more than 5/10    Status  Achieved      PT SHORT TERM GOAL #3   Title  The patient will have improved cervical extension to 50 degrees and lumbar extension to 15 degrees needed for more upright posture when standing    Status  Achieved      PT SHORT TERM GOAL #4   Title  The patient will have improved lumbo/pelvic/hip core strength to grossly 4/5 needed  for standing > 10 minutes for meal prep and washing dishes    Status  Achieved        PT Long Term Goals - 11/03/19 1903      PT LONG TERM GOAL #1   Title  The patient will be independent with safe self progression of HEP    Time  8    Period  Weeks    Status  New    Target Date  12/29/19      PT LONG TERM GOAL #2   Title  The patient will report 4/10 pain with walking > 1 mile    Time  8    Period  Weeks    Status  New      PT LONG TERM GOAL #3   Title  Improved bil HS lengths to > 70 degrees and hip extension > 12 degrees needed for walking longer periods of time    Time  8    Period  Weeks    Status  New      PT LONG TERM GOAL #4   Title  Cervical extensors and lumbo/pelvic/hip core strength grossly 4+/5 needed for standing and walking longer periods of time    Time  8    Period  Weeks    Status  New      PT LONG TERM GOAL #5   Title  FOTO functional outcome score improved from 44% limitation to 32%    Time  8    Period  Weeks    Status  New            Plan - 12/01/19 1752    Clinical Impression Statement  Functional improvements noted particularly with walking approx 1 mile without pain production.  Also much improved cervical, thoracic spinal mobility as well as hip flexor muscle length and increased left hip internal rotation.  Demonstrates good compliance with HEP.  Therapist monitoring response with all interventions. All STGs met.    Comorbidities  chronic history of spinal pain;  kidney disease; HTN; diabetes    Rehab Potential  Good    PT Frequency  2x / week  PT Duration  8 weeks    PT Treatment/Interventions  ADLs/Self Care Home Management;Electrical Stimulation;Cryotherapy;Ultrasound;Traction;Moist Heat;Therapeutic activities;Therapeutic exercise;Neuromuscular re-education;Manual techniques;Patient/family education;Dry needling;Taping    PT Next Visit Plan  try cervical foam roll;  add foam roll ex's to HEP;   cervical retraction. assess response  to  dry needling to Lt upper trap and possibly add left hip flexors, quads;  left hip mobs in prone (emphasize internal rotation)    PT Home Exercise Plan  Access code: [email or web address]       Patient will benefit from skilled therapeutic intervention in order to improve the following deficits and impairments:  Decreased range of motion, Difficulty walking, Pain, Decreased activity tolerance, Impaired perceived functional ability, Impaired flexibility, Decreased strength, Postural dysfunction  Visit Diagnosis: Muscle weakness (generalized)  Chronic low back pain, unspecified back pain laterality, unspecified whether sciatica present  Cervicalgia  Difficulty in walking, not elsewhere classified     Problem List Patient Active Problem List   Diagnosis Date Noted  . Hypertension 11/28/2018  . Uncontrolled type 2 diabetes mellitus with hyperglycemia ([place]) 11/28/2018  . Hypertriglyceridemia 11/28/2018  . Atypical chest pain 01/10/2014  . Chest pain 01/10/2014   Ruben Im, PT 12/01/19 5:57 PM Phone: [phone] Fax: [phone] Alvera Singh 12/01/2019, 5:57 PM  [facility] [address], [place], [place], [address] Phone: [phone]   Fax:  [phone]  Name: PHILO KURTZ MRN: [id number] Date of Birth: 1950-10-18

## 2019-12-03 ENCOUNTER — Other Ambulatory Visit: Payer: Self-pay

## 2019-12-03 ENCOUNTER — Ambulatory Visit: Payer: Medicare HMO | Admitting: Physical Therapy

## 2019-12-03 DIAGNOSIS — M6281 Muscle weakness (generalized): Secondary | ICD-10-CM

## 2019-12-03 DIAGNOSIS — M545 Low back pain, unspecified: Secondary | ICD-10-CM

## 2019-12-03 DIAGNOSIS — G8929 Other chronic pain: Secondary | ICD-10-CM

## 2019-12-03 DIAGNOSIS — M542 Cervicalgia: Secondary | ICD-10-CM

## 2019-12-03 DIAGNOSIS — R262 Difficulty in walking, not elsewhere classified: Secondary | ICD-10-CM | POA: Diagnosis not present

## 2019-12-03 NOTE — Therapy (Addendum)
[facility] [address], [place], [place], [address] Phone: [phone]   Fax:  [phone]  Physical Therapy Treatment  Patient Details  Name: WYMON SWANEY MRN: [id number] Date of Birth: Apr 14, 1951 Referring Provider (PT): Dr. Ninfa Linden    Encounter Date: 12/03/2019  PT End of Session - 12/03/19 2043    Visit Number  7    Date for PT Re-Evaluation  12/29/19    Authorization Type  [employer] 10th visit progress note    PT Start Time  0800    PT Stop Time  0844    PT Time Calculation (min)  44 min    Activity Tolerance  Patient tolerated treatment well       Past Medical History:  Diagnosis Date  . Allergy   . Cancer ([place]) 2010   kidney  . Chronic kidney disease    kidney disease  . Diabetes mellitus without complication (HCC)    prediabetes  . GERD (gastroesophageal reflux disease)   . Hyperlipidemia   . Hypertension     Past Surgical History:  Procedure Laterality Date  . ANAL FISSURE REPAIR    . CATARACT EXTRACTION Bilateral 2012  . NEPHRECTOMY Left 2010   kidney cancer    There were no vitals filed for this visit.  Subjective Assessment - 12/03/19 0806    Subjective  I've got a problem.  I have a stress fracture in my foot.  My neck only hurts with lying flat.  It does not hurt other times.    Pertinent History  Kidney Cancer (lost a kidney) other kidney is not great;  HTN; diabetes; neck issues can't lie with my head flat on the floor/ hurts at the dentist;  the medications I take make me weaker;  Kidney medicine might affect calcium absorption but I've never had bone density test.    How long can you walk comfortably?  On Saturday walked 1 mile  without pain just out of shape.         [facility] PT Assessment - 12/03/19 0001      Observation/Other Assessments   Focus on Therapeutic Outcomes (FOTO)   17% limitation      Strength   Right Hip Extension  4/5    Right Hip ABduction  4/5     Left Hip Extension  4/5    Left Hip ABduction  4/5    Cervical Flexion  4/5    Cervical Extension  3+/5    Lumbar Flexion  4/5    Lumbar Extension  4/5                   [facility] Adult PT Treatment/Exercise - 12/03/19 0001      Lumbar Exercises: Seated   Other Seated Lumbar Exercises  thoracic extension with foam roll 10x     Other Seated Lumbar Exercises  band horzontal abduction with band 15x       Lumbar Exercises: Sidelying   Other Sidelying Lumbar Exercises  open books with leg resting on foam roll 10x       Lumbar Exercises: Quadruped   Other Quadruped Lumbar Exercises  thread the needle with foam roll 10x right/left     Other Quadruped Lumbar Exercises  childs pose with foam roll 10x              PT Education - 12/03/19 2041    Education Details  Access Code: [email or web address]  childs pose; thread the needle, seated  thoracic extensions; seated band horizontal abduction green band    Person(s) Educated  Patient    Methods  Explanation;Demonstration;Handout    Comprehension  Returned demonstration;Verbalized understanding       PT Short Term Goals - 12/01/19 0816      PT SHORT TERM GOAL #1   Title  The patient will demonstrate compliance with initial HEP and postural correction strategies    Status  Achieved      PT SHORT TERM GOAL #2   Title  The patient will be able to walk 3/4 mile with pain level during and after no more than 5/10    Status  Achieved      PT SHORT TERM GOAL #3   Title  The patient will have improved cervical extension to 50 degrees and lumbar extension to 15 degrees needed for more upright posture when standing    Status  Achieved      PT SHORT TERM GOAL #4   Title  The patient will have improved lumbo/pelvic/hip core strength to grossly 4/5 needed for standing > 10 minutes for meal prep and washing dishes    Status  Achieved        PT Long Term Goals - 12/03/19 2050      PT LONG TERM GOAL #1   Title  The patient will be  independent with safe self progression of HEP    Time  8    Period  Weeks    Status  On-going      PT LONG TERM GOAL #2   Title  The patient will report 4/10 pain with walking > 1 mile    Time  8    Period  Weeks    Status  Partially Met      PT LONG TERM GOAL #3   Title  Improved bil HS lengths to > 70 degrees and hip extension > 12 degrees needed for walking longer periods of time    Time  8    Period  Weeks    Status  On-going      PT LONG TERM GOAL #4   Title  Cervical extensors and lumbo/pelvic/hip core strength grossly 4+/5 needed for standing and walking longer periods of time    Time  8    Period  Weeks    Status  Partially Met      PT LONG TERM GOAL #5   Title  FOTO functional outcome score improved from 44% limitation to 32%    Status  Achieved            Plan - 12/03/19 0818    Clinical Impression Statement  The patient has made a significant improvement in FOTO functional outcome measure from 44% limitation to 17%.  Improving hip/core strength as well and much improved postural alignment with head over shoulders alignment rather than prominent head forward.  Much improved LBP and neck pain unfortunately he has a new onset of foot pain and suspects a stress fracture (which he has had several times before).  Discussed current status and he would like to follow up in about 3 weeks.  He will continue with his extensive HEP.  Will assess further progress toward goals at that time.  Therapist modifying ex's secondary to foot pain.    Comorbidities  chronic history of spinal pain;  kidney disease; HTN; diabetes    Rehab Potential  Good    PT Frequency  2x / week    PT Duration  8 weeks    PT Treatment/Interventions  ADLs/Self Care Home Management;Electrical Stimulation;Cryotherapy;Ultrasound;Traction;Moist Heat;Therapeutic activities;Therapeutic exercise;Neuromuscular re-education;Manual techniques;Patient/family education;Dry needling;Taping    PT Next Visit Plan   follow up in 3 weeks;  DN as needed; left hip mobs; spinal core strengthening HEP;  check HS lengths; MMT;  progress toward LTGs    PT Home Exercise Plan  Access code: [email or web address]       Patient will benefit from skilled therapeutic intervention in order to improve the following deficits and impairments:  Decreased range of motion, Difficulty walking, Pain, Decreased activity tolerance, Impaired perceived functional ability, Impaired flexibility, Decreased strength, Postural dysfunction  Visit Diagnosis: Muscle weakness (generalized)  Chronic low back pain, unspecified back pain laterality, unspecified whether sciatica present  Cervicalgia  Difficulty in walking, not elsewhere classified     Problem List Patient Active Problem List   Diagnosis Date Noted  . Hypertension 11/28/2018  . Uncontrolled type 2 diabetes mellitus with hyperglycemia ([place]) 11/28/2018  . Hypertriglyceridemia 11/28/2018  . Atypical chest pain 01/10/2014  . Chest pain 01/10/2014   Ruben Im, PT 12/03/19 8:52 PM Phone: [phone] Fax: [phone] Alvera Singh 12/03/2019, 8:51 PM PHYSICAL THERAPY DISCHARGE SUMMARY  Visits from Start of Care: 7  Current functional level related to goals / functional outcomes: See status above.  Pt didn't return after last PT session.     Remaining deficits: See above for current status.    Education / Equipment: HEP Plan: Patient agrees to discharge.  Patient goals were partially met. Patient is being discharged due to not returning since the last visit.  ?????        Sigurd Sos, PT 02/29/20 10:29 AM   [facility] [address] [place], [place], [address] Phone: [phone]   Fax:  [phone]  Name: ACHILLES NEVILLE MRN: [id number] Date of Birth: [birth year]

## 2019-12-03 NOTE — Patient Instructions (Signed)
Access Code: [email or web address] URL: [email or web address] Date: 12/03/2019 Prepared by: Ruben Im  Exercises Supine Lower Trunk Rotation - 3 reps - 1 sets - 20 hold - 3x daily - 7x weekly Supine Lower Trunk Rotation - 3 reps - 1 sets - 20 hold - 2x daily - 7x weekly Supine Shoulder Horizontal Abduction with Resistance - 10 reps - 1 sets - 1x daily - 7x weekly Supine Chin Tuck - 10 reps - 3 sets - 1x daily - 7x weekly Supine Shoulder External Rotation with Resistance - 10 reps - 1 sets - 1x daily - 7x weekly Chest Stretch on Foam 1/2 Roll Arms Extended - 10 reps - 1 sets - 1x daily - 7x weekly Clamshell - 10 reps - 2 sets - 3x daily - 7x weekly Sidelying Open Book Thoracic Rotation with Knee on Foam Roll - 10 reps - 1 sets - 1x daily - 7x weekly Hip Flexor Stretch at Edge of Bed - 3 reps - 1 sets - 30 hold - 1x daily - 7x weekly Standing Cervical Sidebending AROM - 10 reps - 20 hold - 3x daily - 7x weekly Neck Rotation - 3 reps - 1 sets - 20 hold - 3x daily - 7x weekly Seated Hamstring Stretch - 3 reps - 1 sets - 20 hold - 3x daily - 7x weekly Seated Scapular Retraction - 10 reps - 5 hold - 3x daily - 7x weekly Seated Correct Posture - 10 reps - 3 sets - 1x daily - 7x weekly Seated Cervical Retraction - 10 reps - 1 sets - 5 hold - 2x daily - 7x weekly Seated Piriformis Stretch with Trunk Bend - 3 reps - 1 sets - 20 hold - 3x daily - 7x weekly Seated Shoulder Diagonal with Resistance - 10 reps - 1 sets - 1x daily - 7x weekly doorway hip flexor stretch - 5 reps - 3 sets - 1x daily - 7x weekly Seated Thoracic Lumbar Extension with Pectoralis Stretch - 10 reps - 1 sets - 1x daily - 7x weekly Seated Shoulder Horizontal Abduction with Resistance - 10 reps - 2 sets - 1x daily - 7x weekly Child's Pose with Thread the Needle - 10 reps - 1 sets - 1x daily - 7x weekly Thoracic Extension with Foam Roll - 10 reps - 1 sets - 1x daily - 7x weekly

## 2019-12-04 ENCOUNTER — Ambulatory Visit: Payer: Medicare HMO | Admitting: Podiatry

## 2019-12-04 ENCOUNTER — Other Ambulatory Visit: Payer: Self-pay

## 2019-12-04 ENCOUNTER — Encounter: Payer: Self-pay | Admitting: Podiatry

## 2019-12-04 ENCOUNTER — Ambulatory Visit (INDEPENDENT_AMBULATORY_CARE_PROVIDER_SITE_OTHER): Payer: Medicare HMO

## 2019-12-04 VITALS — Temp 98.0°F

## 2019-12-04 DIAGNOSIS — M779 Enthesopathy, unspecified: Secondary | ICD-10-CM

## 2019-12-04 DIAGNOSIS — M19079 Primary osteoarthritis, unspecified ankle and foot: Secondary | ICD-10-CM

## 2019-12-04 DIAGNOSIS — M7751 Other enthesopathy of right foot: Secondary | ICD-10-CM | POA: Diagnosis not present

## 2019-12-04 NOTE — Progress Notes (Signed)
Subjective:  Patient ID: Dylan Grant, male    DOB: 20-Jan-1951,  MRN: [id number]  Chief Complaint  Patient presents with  . Foot Pain    R foot ball of foot; "do not recall injury, feels like a bruise under 2nd toe; reaccurring, started back tuesday"    69 y.o. male presents with the above complaint.  Patient presents with pain to the right second metatarsophalangeal joint.  Patient states that she has had this issue before is recurring and it feels the same.  He states that he does not recall any traumatic event or injury to the area but he feels like there is a bruit mainly under the second toe.  Patient states it throbs when on it there is little bit of redness associated with it.  Patient states it started back up Tuesday has progressive gotten worse.  He has not tried anything to help decrease the pain.  He would like to make sure that there is nothing serious going on.  He denies any other acute complaint.  He was last treated by Dr. March Rummage.   Review of Systems: Negative except as noted in the HPI. Denies N/V/F/Ch.  Past Medical History:  Diagnosis Date  . Allergy   . Cancer ([place]) 2010   kidney  . Chronic kidney disease    kidney disease  . Diabetes mellitus without complication (HCC)    prediabetes  . GERD (gastroesophageal reflux disease)   . Hyperlipidemia   . Hypertension     Current Outpatient Medications:  .  allopurinol (ZYLOPRIM) 100 MG tablet, Take 1 tablet by mouth daily., Disp: , Rfl:  .  amLODipine (NORVASC) 10 MG tablet, Take 10 mg by mouth daily., Disp: , Rfl: 3 .  amLODipine (NORVASC) 5 MG tablet, Take 5 mg by mouth daily., Disp: , Rfl:  .  aspirin EC 81 MG tablet, Take 81 mg by mouth daily., Disp: , Rfl:  .  cetirizine (ZYRTEC) 10 MG tablet, Take 10 mg by mouth daily., Disp: , Rfl:  .  Cholecalciferol (VITAMIN D) 2000 UNITS tablet, Take 2,000 Units by mouth daily., Disp: , Rfl:  .  Coenzyme Q10 50 MG CAPS, Take 1 capsule by mouth daily., Disp: , Rfl:  .   docusate sodium (COLACE) 100 MG capsule, Take 100 mg by mouth daily. , Disp: , Rfl:  .  famotidine (PEPCID) 20 MG tablet, Take 1 tablet by mouth daily., Disp: , Rfl:  .  fluorouracil (EFUDEX) 5 % cream, Apply topically 2 (two) times daily., Disp: 40 g, Rfl: 0 .  fluticasone (FLONASE) 50 MCG/ACT nasal spray, Place 1 spray into both nostrils daily. , Disp: , Rfl:  .  furosemide (LASIX) 20 MG tablet, Take 20 mg by mouth daily., Disp: , Rfl:  .  metFORMIN (GLUCOPHAGE) 500 MG tablet, Take 500 mg by mouth 2 (two) times daily., Disp: , Rfl:  .  METFORMIN HCL PO, Take 500 mg by mouth 2 (two) times daily. , Disp: , Rfl:  .  Multiple Vitamin (MULTIVITAMIN WITH MINERALS) TABS tablet, Take 1 tablet by mouth daily., Disp: , Rfl:  .  olmesartan (BENICAR) 40 MG tablet, Take 40 mg by mouth daily., Disp: , Rfl:  .  OLMESARTAN MEDOXOMIL PO, , Disp: , Rfl:  .  omega-3 acid ethyl esters (LOVAZA) 1 G capsule, Take 3 g by mouth daily., Disp: , Rfl:  .  rosuvastatin (CRESTOR) 20 MG tablet, TAKE 1 TABLET BY MOUTH EVERY DAY, Disp: 90 tablet, Rfl: 2 .  tamsulosin (FLOMAX) 0.4 MG CAPS capsule, Take 1 capsule by mouth daily., Disp: , Rfl:   Social History   Tobacco Use  Smoking Status Never Smoker  Smokeless Tobacco Never Used    No Known Allergies Objective:   Vitals:   12/04/19 0937  Temp: 49 F (36.7 C)   There is no height or weight on file to calculate BMI. Constitutional Well developed. Well nourished.  Vascular Dorsalis pedis pulses palpable bilaterally. Posterior tibial pulses palpable bilaterally. Capillary refill normal to all digits.  No cyanosis or clubbing noted. Pedal hair growth normal.  Neurologic Normal speech. Oriented to person, place, and time. Epicritic sensation to light touch grossly present bilaterally.  Dermatologic Nails well groomed and normal in appearance. No open wounds. No skin lesions.  Orthopedic:  Right second metatarsophalangeal joint pain on palpation.  Pain with  range of motion of the second digit.  Mild intra-articular pain of the second metatarsophalangeal joint noted.  Pain with range of motion of the second digit on the MTPJ joint with active and passive range of motion.   Radiographs: 3 views of skeletally mature adult foot: Plantar and posterior heel spurring noted.  Midfoot arthritis noted as well.  Mild arthritic changes to the first metatarsophalangeal joint noted.  Slight rotation of the second digit noted.  No arthritic changes at the metatarsophalangeal joint noted there is uneven decrease in space at the second MTPJ joint.   Assessment:   1. Capsulitis of metatarsophalangeal (MTP) joint of right foot   2. Inflammation of foot joint    Plan:  Patient was evaluated and treated and all questions answered.  Right second metatarsophalangeal joint capsulitis -I explained to the patient the etiology of capsulitis and various treatment options associated with it.  I believe patient will benefit from a steroid injection to help decrease the acute inflammatory component of the metatarsophalangeal joint of the second.  Patient states that he would like to proceed with injection. -A steroid injection was performed at 10 using 1% plain Lidocaine and 10 mg of Kenalog. This was well tolerated. -I also believe that patient will benefit from offloading the joint with a surgical shoe. -Surgical shoe was dispensed   No follow-ups on file.

## 2019-12-10 DIAGNOSIS — N189 Chronic kidney disease, unspecified: Secondary | ICD-10-CM | POA: Diagnosis not present

## 2019-12-10 DIAGNOSIS — E1165 Type 2 diabetes mellitus with hyperglycemia: Secondary | ICD-10-CM | POA: Diagnosis not present

## 2019-12-10 DIAGNOSIS — N1831 Chronic kidney disease, stage 3a: Secondary | ICD-10-CM | POA: Diagnosis not present

## 2019-12-10 DIAGNOSIS — E785 Hyperlipidemia, unspecified: Secondary | ICD-10-CM | POA: Diagnosis not present

## 2019-12-14 ENCOUNTER — Ambulatory Visit: Payer: PRIVATE HEALTH INSURANCE | Admitting: Physical Medicine and Rehabilitation

## 2019-12-17 DIAGNOSIS — E785 Hyperlipidemia, unspecified: Secondary | ICD-10-CM | POA: Diagnosis not present

## 2019-12-17 DIAGNOSIS — M25552 Pain in left hip: Secondary | ICD-10-CM | POA: Diagnosis not present

## 2019-12-17 DIAGNOSIS — C649 Malignant neoplasm of unspecified kidney, except renal pelvis: Secondary | ICD-10-CM | POA: Diagnosis not present

## 2019-12-17 DIAGNOSIS — I129 Hypertensive chronic kidney disease with stage 1 through stage 4 chronic kidney disease, or unspecified chronic kidney disease: Secondary | ICD-10-CM | POA: Diagnosis not present

## 2019-12-17 DIAGNOSIS — N1831 Chronic kidney disease, stage 3a: Secondary | ICD-10-CM | POA: Diagnosis not present

## 2019-12-17 DIAGNOSIS — D631 Anemia in chronic kidney disease: Secondary | ICD-10-CM | POA: Diagnosis not present

## 2019-12-17 DIAGNOSIS — E1165 Type 2 diabetes mellitus with hyperglycemia: Secondary | ICD-10-CM | POA: Diagnosis not present

## 2019-12-21 ENCOUNTER — Encounter: Payer: Self-pay | Admitting: Physical Medicine and Rehabilitation

## 2019-12-21 ENCOUNTER — Ambulatory Visit: Payer: Self-pay

## 2019-12-21 ENCOUNTER — Other Ambulatory Visit: Payer: Self-pay

## 2019-12-21 ENCOUNTER — Ambulatory Visit (INDEPENDENT_AMBULATORY_CARE_PROVIDER_SITE_OTHER): Payer: Medicare HMO | Admitting: Physical Medicine and Rehabilitation

## 2019-12-21 DIAGNOSIS — M25552 Pain in left hip: Secondary | ICD-10-CM | POA: Diagnosis not present

## 2019-12-21 NOTE — Progress Notes (Signed)
 .  Numeric Pain Rating Scale and Functional Assessment Average Pain 4   In the last MONTH (on 0-10 scale) has pain interfered with the following?  1. General activity like being  able to carry out your everyday physical activities such as walking, climbing stairs, carrying groceries, or moving a chair?  Rating(5)    -Dye Allergies (kidney problems).

## 2019-12-21 NOTE — Progress Notes (Signed)
Dylan Grant - 69 y.o. male MRN [id number]  Date of birth: 1951/06/05  Office Visit Note: Visit Date: 12/21/2019 PCP: Josetta Huddle, MD Referred by: Josetta Huddle, MD  Subjective: Chief Complaint  Patient presents with  . Left Hip - Pain   HPI: Dylan Grant is a 69 y.o. male who comes in today For eval ration and management of left hip pain at the request of Dr. Jean Rosenthal.  Patient reports pain in the left hip that started in October 2020 without significant trauma.  He did notice clicking of the left hip with flexion and movement and then a great deal of pain really with any sort of movement at that time.  He denies any right-sided complaints.  He has no radicular pain or paresthesia.  Case is complicated by type 2 diabetes.  He reports a history of walking for exercise quite extensively and it got to the point where he just could not hardly walk because of the pain.  This was evaluated by Dr. Ninfa Linden with x-rays of the hip and pelvis showing not much in the way of significant problems anatomically.  Patient did have some pain with rotation of the hip.  At the time failing conservative care with medication management and time Dr. Ninfa Linden suggested potential for intra-articular hip anesthetic arthrogram or hip injection along with possible iliopsoas injection.  He also talk to the patient about possible MRI of the hip.  Since that time the patient has received his corona vaccine.  He wanted to delay any treatment from a steroid standpoint until after that vaccine was completed.  Interestingly during that time he really laid off a lot of his walking and his symptoms have calmed down quite a bit.  He rates his pain as a 4 out of 10.  He still has the clicking when he moves but that is not something is really concerned about.  The level of pain is pretty mild at this point.  He has had no focal this.  Review of Systems  Constitutional: Negative for chills, fever, malaise/fatigue and  weight loss.  HENT: Negative for hearing loss and sinus pain.   Eyes: Negative for blurred vision, double vision and photophobia.  Respiratory: Negative for cough and shortness of breath.   Cardiovascular: Negative for chest pain, palpitations and leg swelling.  Gastrointestinal: Negative for abdominal pain, nausea and vomiting.  Genitourinary: Negative for flank pain.  Musculoskeletal: Positive for joint pain. Negative for myalgias.  Skin: Negative for itching and rash.  Neurological: Negative for tremors, focal weakness and weakness.  Endo/Heme/Allergies: Negative.   Psychiatric/Behavioral: Negative for depression.  All other systems reviewed and are negative.  Otherwise per HPI.  Assessment & Plan: Visit Diagnoses:  1. Pain in left hip     Plan: Findings:  Chronic hip pain consistent with pain into the groin worse with movement also with clicking or snapping hip syndrome.  Patient has gotten much better over time waiting for completion of his corona vaccine.  At this point he is going to increase his activity level and walking and see if this flares up any since it calm down so much.  If it does start to flareup he is going to call us back for intra-articular hip injection or he tells me today he may just go ahead and get MRI of the hip.  He can follow-up with Dr. Ninfa Linden obviously for that.  We did talk at length about steroid medication as well as contrast medication  given the fact that he says that he only has 1 kidney.  We reassured them that we use a very small amount of contrast and its intra-articular and not intravascular.    Meds & Orders: No orders of the defined types were placed in this encounter.   No orders of the defined types were placed in this encounter.   Follow-up: Return for visit to requesting physician as needed.   Procedures: No procedures performed  No notes on file   Clinical History: No specialty comments available.   He reports that he has never  smoked. He has never used smokeless tobacco. No results for input(s): HGBA1C, LABURIC in the last 8760 hours.  Objective:  VS:  HT:    WT:   BMI:     BP:   HR: bpm  TEMP: ( )  RESP:  Physical Exam Vitals and nursing note reviewed.  Constitutional:      General: He is not in acute distress.    Appearance: He is well-developed.  HENT:     Head: Normocephalic and atraumatic.     Nose: Nose normal.     Mouth/Throat:     Mouth: Mucous membranes are moist.     Pharynx: Oropharynx is clear.  Eyes:     Conjunctiva/sclera: Conjunctivae normal.     Pupils: Pupils are equal, round, and reactive to light.  Neck:     Trachea: No tracheal deviation.  Cardiovascular:     Rate and Rhythm: Normal rate and regular rhythm.     Pulses: Normal pulses.  Pulmonary:     Effort: Pulmonary effort is normal.     Breath sounds: Normal breath sounds.  Abdominal:     General: There is no distension.     Palpations: Abdomen is soft.     Tenderness: There is no guarding or rebound.  Musculoskeletal:        General: No deformity.     Cervical back: Normal range of motion and neck supple.     Right lower leg: No edema.     Left lower leg: No edema.     Comments: Patient ambulates without aid.  Somewhat slow to rise from seated position.  He has no real pain with rotation of the hips bilaterally internal or external.  No pain over the greater trochanter.  When he actively flexes his hip he does have a snapping or clicking audible sound.  Skin:    General: Skin is warm and dry.     Findings: No erythema or rash.  Neurological:     General: No focal deficit present.     Mental Status: He is alert and oriented to person, place, and time.     Motor: No abnormal muscle tone.     Coordination: Coordination normal.     Gait: Gait normal.  Psychiatric:        Mood and Affect: Mood normal.        Behavior: Behavior normal.        Thought Content: Thought content normal.     Ortho Exam Imaging: No  results found.  Past Medical/Family/Surgical/Social History: Medications & Allergies reviewed per EMR, new medications updated. Patient Active Problem List   Diagnosis Date Noted  . Hypertension 11/28/2018  . Uncontrolled type 2 diabetes mellitus with hyperglycemia ([place]) 11/28/2018  . Hypertriglyceridemia 11/28/2018  . Atypical chest pain 01/10/2014  . Chest pain 01/10/2014   Past Medical History:  Diagnosis Date  . Allergy   . Cancer ([place])  2010   kidney  . Chronic kidney disease    kidney disease  . Diabetes mellitus without complication (HCC)    prediabetes  . GERD (gastroesophageal reflux disease)   . Hyperlipidemia   . Hypertension    Family History  Problem Relation Age of Onset  . Hypertension Mother   . Dementia Mother   . Heart attack Father   . Hyperlipidemia Brother   . Hypertension Brother   . Colon cancer Neg Hx    Past Surgical History:  Procedure Laterality Date  . ANAL FISSURE REPAIR    . CATARACT EXTRACTION Bilateral 2012  . NEPHRECTOMY Left 2010   kidney cancer   Social History   Occupational History  . Not on file  Tobacco Use  . Smoking status: Never Smoker  . Smokeless tobacco: Never Used  Substance and Sexual Activity  . Alcohol use: No    Alcohol/week: 0.0 standard drinks  . Drug use: No  . Sexual activity: Not on file

## 2019-12-29 ENCOUNTER — Ambulatory Visit: Payer: Medicare HMO | Admitting: Orthopaedic Surgery

## 2019-12-31 ENCOUNTER — Ambulatory Visit: Payer: Medicare HMO | Admitting: Physical Therapy

## 2020-01-01 ENCOUNTER — Ambulatory Visit: Payer: Medicare HMO | Admitting: Podiatry

## 2020-01-05 ENCOUNTER — Ambulatory Visit: Payer: Medicare HMO | Admitting: Orthopaedic Surgery

## 2020-01-06 DIAGNOSIS — C642 Malignant neoplasm of left kidney, except renal pelvis: Secondary | ICD-10-CM | POA: Diagnosis not present

## 2020-01-06 DIAGNOSIS — R3915 Urgency of urination: Secondary | ICD-10-CM | POA: Diagnosis not present

## 2020-02-18 DIAGNOSIS — E781 Pure hyperglyceridemia: Secondary | ICD-10-CM | POA: Diagnosis not present

## 2020-02-18 DIAGNOSIS — E119 Type 2 diabetes mellitus without complications: Secondary | ICD-10-CM | POA: Diagnosis not present

## 2020-02-18 DIAGNOSIS — N183 Chronic kidney disease, stage 3 unspecified: Secondary | ICD-10-CM | POA: Diagnosis not present

## 2020-02-18 DIAGNOSIS — D4100 Neoplasm of uncertain behavior of unspecified kidney: Secondary | ICD-10-CM | POA: Diagnosis not present

## 2020-02-18 DIAGNOSIS — E785 Hyperlipidemia, unspecified: Secondary | ICD-10-CM | POA: Diagnosis not present

## 2020-02-18 DIAGNOSIS — N401 Enlarged prostate with lower urinary tract symptoms: Secondary | ICD-10-CM | POA: Diagnosis not present

## 2020-02-18 DIAGNOSIS — E7800 Pure hypercholesterolemia, unspecified: Secondary | ICD-10-CM | POA: Diagnosis not present

## 2020-02-18 DIAGNOSIS — I10 Essential (primary) hypertension: Secondary | ICD-10-CM | POA: Diagnosis not present

## 2020-02-18 DIAGNOSIS — D4120 Neoplasm of uncertain behavior of unspecified ureter: Secondary | ICD-10-CM | POA: Diagnosis not present

## 2020-02-18 DIAGNOSIS — E782 Mixed hyperlipidemia: Secondary | ICD-10-CM | POA: Diagnosis not present

## 2020-02-19 DIAGNOSIS — N401 Enlarged prostate with lower urinary tract symptoms: Secondary | ICD-10-CM | POA: Diagnosis not present

## 2020-02-19 DIAGNOSIS — R350 Frequency of micturition: Secondary | ICD-10-CM | POA: Diagnosis not present

## 2020-02-19 DIAGNOSIS — R3915 Urgency of urination: Secondary | ICD-10-CM | POA: Diagnosis not present

## 2020-02-19 DIAGNOSIS — C642 Malignant neoplasm of left kidney, except renal pelvis: Secondary | ICD-10-CM | POA: Diagnosis not present

## 2020-02-24 ENCOUNTER — Other Ambulatory Visit: Payer: Self-pay

## 2020-02-24 ENCOUNTER — Telehealth: Payer: Self-pay | Admitting: Orthopaedic Surgery

## 2020-02-24 DIAGNOSIS — M25552 Pain in left hip: Secondary | ICD-10-CM

## 2020-02-24 NOTE — Telephone Encounter (Signed)
MRI order in.

## 2020-02-24 NOTE — Telephone Encounter (Signed)
Patient called.   He was told if his injection from [facility] did not work then he would be sent in for an MRI. He is now wanting to move forward with the MRI   Call back: [phone]

## 2020-03-01 DIAGNOSIS — K8020 Calculus of gallbladder without cholecystitis without obstruction: Secondary | ICD-10-CM | POA: Diagnosis not present

## 2020-03-01 DIAGNOSIS — C642 Malignant neoplasm of left kidney, except renal pelvis: Secondary | ICD-10-CM | POA: Diagnosis not present

## 2020-03-17 DIAGNOSIS — H40013 Open angle with borderline findings, low risk, bilateral: Secondary | ICD-10-CM | POA: Diagnosis not present

## 2020-03-17 DIAGNOSIS — H35371 Puckering of macula, right eye: Secondary | ICD-10-CM | POA: Diagnosis not present

## 2020-03-17 DIAGNOSIS — H33311 Horseshoe tear of retina without detachment, right eye: Secondary | ICD-10-CM | POA: Diagnosis not present

## 2020-03-17 DIAGNOSIS — E119 Type 2 diabetes mellitus without complications: Secondary | ICD-10-CM | POA: Diagnosis not present

## 2020-03-21 DIAGNOSIS — E785 Hyperlipidemia, unspecified: Secondary | ICD-10-CM | POA: Diagnosis not present

## 2020-03-21 DIAGNOSIS — N189 Chronic kidney disease, unspecified: Secondary | ICD-10-CM | POA: Diagnosis not present

## 2020-03-21 DIAGNOSIS — N1831 Chronic kidney disease, stage 3a: Secondary | ICD-10-CM | POA: Diagnosis not present

## 2020-03-24 DIAGNOSIS — H90A22 Sensorineural hearing loss, unilateral, left ear, with restricted hearing on the contralateral side: Secondary | ICD-10-CM | POA: Diagnosis not present

## 2020-03-24 DIAGNOSIS — H90A31 Mixed conductive and sensorineural hearing loss, unilateral, right ear with restricted hearing on the contralateral side: Secondary | ICD-10-CM | POA: Diagnosis not present

## 2020-03-25 ENCOUNTER — Other Ambulatory Visit: Payer: Self-pay

## 2020-03-25 ENCOUNTER — Ambulatory Visit
Admission: RE | Admit: 2020-03-25 | Discharge: 2020-03-25 | Disposition: A | Discharge status: Home / Self Care | Payer: Medicare HMO | Source: Ambulatory Visit | Attending: Orthopaedic Surgery | Admitting: Orthopaedic Surgery

## 2020-03-25 DIAGNOSIS — M25552 Pain in left hip: Secondary | ICD-10-CM

## 2020-03-25 DIAGNOSIS — S73192A Other sprain of left hip, initial encounter: Secondary | ICD-10-CM | POA: Diagnosis not present

## 2020-03-25 DIAGNOSIS — M1612 Unilateral primary osteoarthritis, left hip: Secondary | ICD-10-CM | POA: Diagnosis not present

## 2020-03-29 ENCOUNTER — Ambulatory Visit: Payer: Medicare HMO | Admitting: Orthopaedic Surgery

## 2020-03-29 ENCOUNTER — Encounter: Payer: Self-pay | Admitting: Orthopaedic Surgery

## 2020-03-29 ENCOUNTER — Ambulatory Visit: Payer: Self-pay

## 2020-03-29 ENCOUNTER — Other Ambulatory Visit: Payer: Self-pay

## 2020-03-29 DIAGNOSIS — M25552 Pain in left hip: Secondary | ICD-10-CM

## 2020-03-29 DIAGNOSIS — H3581 Retinal edema: Secondary | ICD-10-CM | POA: Diagnosis not present

## 2020-03-29 DIAGNOSIS — H43813 Vitreous degeneration, bilateral: Secondary | ICD-10-CM | POA: Diagnosis not present

## 2020-03-29 DIAGNOSIS — H35371 Puckering of macula, right eye: Secondary | ICD-10-CM | POA: Diagnosis not present

## 2020-03-29 DIAGNOSIS — H31091 Other chorioretinal scars, right eye: Secondary | ICD-10-CM | POA: Diagnosis not present

## 2020-03-29 NOTE — Progress Notes (Signed)
Patient is a very pleasant 69 year old gentleman who comes in today to go over an MRI of his left hip.  His pain is been at the anterior aspect of his left hip with flexion.  After he walks for a long period time he gets a lot of pain in the right hip.  After the very conservative treatment I did send in for a MRI to better assess the hip.  He has had normal internal and external rotation but mainly pain with hip flexion.  The pain seems to be in the groin.  There is no sciatic component.  He does have chronic low back pain.  There are no radicular components of his pain otherwise.  On exam I can still put his left hip through internal and external rotation and with flexion he does have some pain in the groin area.  The MRI shows mild osteoarthritis of both hips but I do not see any significant edema in the roof of the acetabulum or the femoral head.  Given his symptoms, I have recommended a one-time intra-articular steroid injection in the left hip joint under radiographic guidance.  We will see if he can be seen by Dr. Junius Roads today for this type of injection which would hopefully be diagnostic and therapeutic.  I do feel this is reasonable given the MRI findings and clinical exam findings and the patient agrees with this.  I can then see him back in follow-up in about 4 weeks.  All questions and concerns were answered and addressed.

## 2020-03-29 NOTE — Progress Notes (Signed)
Subjective: Patient is here for ultrasound-guided intra-articular left hip injection.   He has pain in the groin area but not reproducible on examination.  It hurts primarily when walking more than about 50 yards.  Objective: Good range of motion and no pain on exam today.  Procedure: Ultrasound-guided left hip injection: After sterile prep with Betadine, injected 8 cc 1% lidocaine without epinephrine and 40 mg methylprednisolone using a 22-gauge spinal needle, passing the needle through the iliofemoral ligament into the femoral head/neck junction.  Injectate was seen filling the joint capsule.  He will "test" his hip today and report back to Dr. Ninfa Linden.

## 2020-03-31 ENCOUNTER — Encounter (INDEPENDENT_AMBULATORY_CARE_PROVIDER_SITE_OTHER): Payer: Medicare HMO | Admitting: Ophthalmology

## 2020-04-26 ENCOUNTER — Ambulatory Visit: Payer: Medicare HMO | Admitting: Orthopaedic Surgery

## 2020-04-26 ENCOUNTER — Encounter: Payer: Self-pay | Admitting: Orthopaedic Surgery

## 2020-04-26 DIAGNOSIS — M25552 Pain in left hip: Secondary | ICD-10-CM

## 2020-04-26 DIAGNOSIS — M1612 Unilateral primary osteoarthritis, left hip: Secondary | ICD-10-CM | POA: Diagnosis not present

## 2020-04-26 NOTE — Progress Notes (Signed)
Dylan Grant comes in today a month after having a steroid injection under ultrasound by Dr. Junius Roads in his left hip.  An MRI of the hip did show mild arthritis but no edema in the femoral head or acetabulum.  He has been having a lot of pain with activities.  He says is more than 50% better with the injection and is better with his daily activities.  He says that he is walking the pain does not make him stop like it used to.  He does report more of a tightness and pain in his left hip and he points to the anterior hip as the source of his pain.  On exam there is just a little stiffness with internal and external rotation of the left hip and with flexion.  The pain is not significant thus far.  A long thorough discussion about his hip.  At this point, I would recommend activity modification and even topical anti-inflammatory such as Voltaren gel and I did talk with him about this.  He knows that he can always have an injection again in 5 to 6 months if needed.  All question concerns were answered and addressed.  If things worsen he will let us know.  Otherwise, follow-up is as needed.

## 2020-04-28 DIAGNOSIS — R69 Illness, unspecified: Secondary | ICD-10-CM | POA: Diagnosis not present

## 2020-05-19 DIAGNOSIS — H35371 Puckering of macula, right eye: Secondary | ICD-10-CM | POA: Diagnosis not present

## 2020-05-19 DIAGNOSIS — H3581 Retinal edema: Secondary | ICD-10-CM | POA: Diagnosis not present

## 2020-05-19 DIAGNOSIS — H26491 Other secondary cataract, right eye: Secondary | ICD-10-CM | POA: Diagnosis not present

## 2020-05-19 DIAGNOSIS — E113411 Type 2 diabetes mellitus with severe nonproliferative diabetic retinopathy with macular edema, right eye: Secondary | ICD-10-CM | POA: Diagnosis not present

## 2020-05-19 DIAGNOSIS — G8918 Other acute postprocedural pain: Secondary | ICD-10-CM | POA: Diagnosis not present

## 2020-05-21 ENCOUNTER — Other Ambulatory Visit: Payer: Self-pay | Admitting: Cardiology

## 2020-05-24 DIAGNOSIS — N189 Chronic kidney disease, unspecified: Secondary | ICD-10-CM | POA: Diagnosis not present

## 2020-05-24 DIAGNOSIS — E785 Hyperlipidemia, unspecified: Secondary | ICD-10-CM | POA: Diagnosis not present

## 2020-05-24 DIAGNOSIS — N1831 Chronic kidney disease, stage 3a: Secondary | ICD-10-CM | POA: Diagnosis not present

## 2020-05-24 DIAGNOSIS — E1165 Type 2 diabetes mellitus with hyperglycemia: Secondary | ICD-10-CM | POA: Diagnosis not present

## 2020-05-25 DIAGNOSIS — Z85528 Personal history of other malignant neoplasm of kidney: Secondary | ICD-10-CM | POA: Diagnosis not present

## 2020-05-25 DIAGNOSIS — R3915 Urgency of urination: Secondary | ICD-10-CM | POA: Diagnosis not present

## 2020-05-25 DIAGNOSIS — N401 Enlarged prostate with lower urinary tract symptoms: Secondary | ICD-10-CM | POA: Diagnosis not present

## 2020-05-31 DIAGNOSIS — H35371 Puckering of macula, right eye: Secondary | ICD-10-CM | POA: Diagnosis not present

## 2020-05-31 DIAGNOSIS — H31091 Other chorioretinal scars, right eye: Secondary | ICD-10-CM | POA: Diagnosis not present

## 2020-06-01 DIAGNOSIS — N1831 Chronic kidney disease, stage 3a: Secondary | ICD-10-CM | POA: Diagnosis not present

## 2020-06-01 DIAGNOSIS — C649 Malignant neoplasm of unspecified kidney, except renal pelvis: Secondary | ICD-10-CM | POA: Diagnosis not present

## 2020-06-01 DIAGNOSIS — I129 Hypertensive chronic kidney disease with stage 1 through stage 4 chronic kidney disease, or unspecified chronic kidney disease: Secondary | ICD-10-CM | POA: Diagnosis not present

## 2020-06-01 DIAGNOSIS — N2581 Secondary hyperparathyroidism of renal origin: Secondary | ICD-10-CM | POA: Diagnosis not present

## 2020-06-01 DIAGNOSIS — D631 Anemia in chronic kidney disease: Secondary | ICD-10-CM | POA: Diagnosis not present

## 2020-06-01 DIAGNOSIS — E785 Hyperlipidemia, unspecified: Secondary | ICD-10-CM | POA: Diagnosis not present

## 2020-06-01 DIAGNOSIS — R739 Hyperglycemia, unspecified: Secondary | ICD-10-CM | POA: Diagnosis not present

## 2020-06-13 DIAGNOSIS — R350 Frequency of micturition: Secondary | ICD-10-CM | POA: Diagnosis not present

## 2020-06-13 DIAGNOSIS — N401 Enlarged prostate with lower urinary tract symptoms: Secondary | ICD-10-CM | POA: Diagnosis not present

## 2020-06-20 DIAGNOSIS — N401 Enlarged prostate with lower urinary tract symptoms: Secondary | ICD-10-CM | POA: Diagnosis not present

## 2020-06-20 DIAGNOSIS — E782 Mixed hyperlipidemia: Secondary | ICD-10-CM | POA: Diagnosis not present

## 2020-06-20 DIAGNOSIS — D4100 Neoplasm of uncertain behavior of unspecified kidney: Secondary | ICD-10-CM | POA: Diagnosis not present

## 2020-06-20 DIAGNOSIS — E119 Type 2 diabetes mellitus without complications: Secondary | ICD-10-CM | POA: Diagnosis not present

## 2020-06-20 DIAGNOSIS — N183 Chronic kidney disease, stage 3 unspecified: Secondary | ICD-10-CM | POA: Diagnosis not present

## 2020-06-20 DIAGNOSIS — E7800 Pure hypercholesterolemia, unspecified: Secondary | ICD-10-CM | POA: Diagnosis not present

## 2020-06-20 DIAGNOSIS — I10 Essential (primary) hypertension: Secondary | ICD-10-CM | POA: Diagnosis not present

## 2020-06-20 DIAGNOSIS — D4120 Neoplasm of uncertain behavior of unspecified ureter: Secondary | ICD-10-CM | POA: Diagnosis not present

## 2020-06-20 DIAGNOSIS — E781 Pure hyperglyceridemia: Secondary | ICD-10-CM | POA: Diagnosis not present

## 2020-06-20 DIAGNOSIS — E785 Hyperlipidemia, unspecified: Secondary | ICD-10-CM | POA: Diagnosis not present

## 2020-06-21 DIAGNOSIS — H3581 Retinal edema: Secondary | ICD-10-CM | POA: Diagnosis not present

## 2020-07-04 DIAGNOSIS — R69 Illness, unspecified: Secondary | ICD-10-CM | POA: Diagnosis not present

## 2020-07-29 DIAGNOSIS — N401 Enlarged prostate with lower urinary tract symptoms: Secondary | ICD-10-CM | POA: Diagnosis not present

## 2020-07-29 DIAGNOSIS — R3915 Urgency of urination: Secondary | ICD-10-CM | POA: Diagnosis not present

## 2020-07-29 DIAGNOSIS — R351 Nocturia: Secondary | ICD-10-CM | POA: Diagnosis not present

## 2020-08-25 DIAGNOSIS — E119 Type 2 diabetes mellitus without complications: Secondary | ICD-10-CM | POA: Diagnosis not present

## 2020-08-25 DIAGNOSIS — E7800 Pure hypercholesterolemia, unspecified: Secondary | ICD-10-CM | POA: Diagnosis not present

## 2020-08-25 DIAGNOSIS — N401 Enlarged prostate with lower urinary tract symptoms: Secondary | ICD-10-CM | POA: Diagnosis not present

## 2020-08-25 DIAGNOSIS — E781 Pure hyperglyceridemia: Secondary | ICD-10-CM | POA: Diagnosis not present

## 2020-08-25 DIAGNOSIS — I10 Essential (primary) hypertension: Secondary | ICD-10-CM | POA: Diagnosis not present

## 2020-08-25 DIAGNOSIS — K219 Gastro-esophageal reflux disease without esophagitis: Secondary | ICD-10-CM | POA: Diagnosis not present

## 2020-08-25 DIAGNOSIS — E782 Mixed hyperlipidemia: Secondary | ICD-10-CM | POA: Diagnosis not present

## 2020-08-25 DIAGNOSIS — D4120 Neoplasm of uncertain behavior of unspecified ureter: Secondary | ICD-10-CM | POA: Diagnosis not present

## 2020-08-25 DIAGNOSIS — E785 Hyperlipidemia, unspecified: Secondary | ICD-10-CM | POA: Diagnosis not present

## 2020-08-25 DIAGNOSIS — D4100 Neoplasm of uncertain behavior of unspecified kidney: Secondary | ICD-10-CM | POA: Diagnosis not present

## 2020-08-31 ENCOUNTER — Encounter: Payer: Self-pay | Admitting: Orthopaedic Surgery

## 2020-08-31 ENCOUNTER — Ambulatory Visit: Payer: Medicare HMO | Admitting: Orthopaedic Surgery

## 2020-08-31 ENCOUNTER — Ambulatory Visit (INDEPENDENT_AMBULATORY_CARE_PROVIDER_SITE_OTHER): Payer: Medicare HMO

## 2020-08-31 DIAGNOSIS — M25551 Pain in right hip: Secondary | ICD-10-CM

## 2020-08-31 DIAGNOSIS — M7062 Trochanteric bursitis, left hip: Secondary | ICD-10-CM

## 2020-08-31 DIAGNOSIS — G8929 Other chronic pain: Secondary | ICD-10-CM

## 2020-08-31 DIAGNOSIS — M7061 Trochanteric bursitis, right hip: Secondary | ICD-10-CM

## 2020-08-31 DIAGNOSIS — M5441 Lumbago with sciatica, right side: Secondary | ICD-10-CM | POA: Diagnosis not present

## 2020-08-31 NOTE — Addendum Note (Signed)
Addended by: Robyne Peers on: 08/31/2020 11:32 AM   Modules accepted: Orders

## 2020-08-31 NOTE — Progress Notes (Signed)
Office Visit Note   Patient: Dylan Grant           Date of Birth: [birth year]           MRN: [id number] Visit Date: 08/31/2020              Requested by: Josetta Huddle, MD [address] [place],  [place] [address] PCP: Josetta Huddle, MD   Assessment & Plan: Visit Diagnoses:  1. Pain in right hip     Plan: At this point he would benefit from outpatient physical therapy for his lumbar spine with any modalities per the therapist that can help decrease the pain and discomfort and loosen up the muscle and tissues around his spine.  Also they can work on trochanteric bursitis and IT band syndrome on both sides and quad strengthening.  Again any modalities that can help improve his function and decrease his pain.  He agrees with this treatment plan.  We will order also a MRI of the lumbar spine given the severe degenerative disc disease worsening at multiple levels.  Given his sciatic symptoms I think this is reasonable as well.  He agrees with this treatment plan.  All questions and concerns were answered and addressed.  Follow-Up Instructions: Return in about 4 weeks (around 09/28/2020).   Orders:  Orders Placed This Encounter  Procedures  . XR HIP UNILAT W OR W/O PELVIS 2-3 VIEWS RIGHT   No orders of the defined types were placed in this encounter.     Procedures: No procedures performed   Clinical Data: No additional findings.   Subjective: Chief Complaint  Patient presents with  . Right Hip - Pain  The patient is a very active 69 year old gentleman that I have seen before.  He comes in with continued right-sided low back pain that hurts in the lateral aspect of his lumbar spine to the right side it starts of the ribs and does radiate into the sciatic area and then into the anterior thigh.  He did does report some right groin pain.  I have seen him before for his left shoulder and his left hip trochanteric area.  A MRI of his left hip done earlier this year showed  some mild arthritis in both hips and some tendinosis of the hip abductor tendons and external rotators.  There was no tear.  Previous lumbar spine films showed severe degenerative changes in lumbar spine especially at L5-S1.  He is never had spine surgery.  He has to work on stretching every night or he is uncomfortable when he sleeps.  When he does walk with an exercise walk he does not have significant discomfort.  He is not a diabetic.  He denies any other acute changes in his medical status.  He does report anterior right thigh pain and weakness on occasion.  It did flareup yesterday when he was helping a friend and it was worse after climbing a ladder.  HPI  Review of Systems There is currently no headache, chest pain, shortness of breath, fever, chills, nausea, vomiting  Objective: Vital Signs: There were no vitals taken for this visit.  Physical Exam He is alert and oriented x3 and in no acute distress Ortho Exam Examination of both hips shows stiffness with internal and external rotation but no significant pain.  He does have limited flexion extension of lumbar spine with pain in the paraspinal muscles to the right side.  There is a radicular component of his pain and  sciatic region in the anterior thigh on the right side. Specialty Comments:  No specialty comments available.  Imaging: XR HIP UNILAT W OR W/O PELVIS 2-3 VIEWS RIGHT  Result Date: 08/31/2020 An AP pelvis and lateral of the right hip shows mild arthritic changes in both hips with no acute findings.    PMFS History: Patient Active Problem List   Diagnosis Date Noted  . Unilateral primary osteoarthritis, left hip 04/26/2020  . Hypertension 11/28/2018  . Uncontrolled type 2 diabetes mellitus with hyperglycemia ([place]) 11/28/2018  . Hypertriglyceridemia 11/28/2018  . Atypical chest pain 01/10/2014  . Chest pain 01/10/2014   Past Medical History:  Diagnosis Date  . Allergy   . Cancer ([place]) 2010   kidney  . Chronic  kidney disease    kidney disease  . Diabetes mellitus without complication (HCC)    prediabetes  . GERD (gastroesophageal reflux disease)   . Hyperlipidemia   . Hypertension     Family History  Problem Relation Age of Onset  . Hypertension Mother   . Dementia Mother   . Heart attack Father   . Hyperlipidemia Brother   . Hypertension Brother   . Colon cancer Neg Hx     Past Surgical History:  Procedure Laterality Date  . ANAL FISSURE REPAIR    . CATARACT EXTRACTION Bilateral 2012  . NEPHRECTOMY Left 2010   kidney cancer   Social History   Occupational History  . Not on file  Tobacco Use  . Smoking status: Never Smoker  . Smokeless tobacco: Never Used  Vaping Use  . Vaping Use: Never used  Substance and Sexual Activity  . Alcohol use: No    Alcohol/week: 0.0 standard drinks  . Drug use: No  . Sexual activity: Not on file

## 2020-09-01 DIAGNOSIS — N1831 Chronic kidney disease, stage 3a: Secondary | ICD-10-CM | POA: Diagnosis not present

## 2020-09-12 ENCOUNTER — Other Ambulatory Visit: Payer: Self-pay

## 2020-09-12 ENCOUNTER — Ambulatory Visit
Admission: RE | Admit: 2020-09-12 | Discharge: 2020-09-12 | Disposition: A | Discharge status: Home / Self Care | Payer: Medicare HMO | Source: Ambulatory Visit | Attending: Orthopaedic Surgery | Admitting: Orthopaedic Surgery

## 2020-09-12 DIAGNOSIS — G8929 Other chronic pain: Secondary | ICD-10-CM

## 2020-09-12 DIAGNOSIS — M48061 Spinal stenosis, lumbar region without neurogenic claudication: Secondary | ICD-10-CM | POA: Diagnosis not present

## 2020-09-12 DIAGNOSIS — M545 Low back pain, unspecified: Secondary | ICD-10-CM | POA: Diagnosis not present

## 2020-09-12 DIAGNOSIS — M5441 Lumbago with sciatica, right side: Secondary | ICD-10-CM

## 2020-09-21 ENCOUNTER — Encounter: Payer: Self-pay | Admitting: Rehabilitative and Restorative Service Providers"

## 2020-09-21 ENCOUNTER — Ambulatory Visit: Payer: Medicare HMO | Admitting: Rehabilitative and Restorative Service Providers"

## 2020-09-21 ENCOUNTER — Other Ambulatory Visit: Payer: Self-pay

## 2020-09-21 DIAGNOSIS — M25651 Stiffness of right hip, not elsewhere classified: Secondary | ICD-10-CM

## 2020-09-21 DIAGNOSIS — G8929 Other chronic pain: Secondary | ICD-10-CM

## 2020-09-21 DIAGNOSIS — R293 Abnormal posture: Secondary | ICD-10-CM | POA: Diagnosis not present

## 2020-09-21 DIAGNOSIS — M5441 Lumbago with sciatica, right side: Secondary | ICD-10-CM

## 2020-09-21 DIAGNOSIS — M25652 Stiffness of left hip, not elsewhere classified: Secondary | ICD-10-CM

## 2020-09-21 NOTE — Addendum Note (Signed)
Addended by: Wende Crease on: 09/21/2020 05:50 PM   Modules accepted: Orders

## 2020-09-21 NOTE — Patient Instructions (Signed)
Access Code: [email or web address] URL: [email or web address] Date: 09/21/2020 Prepared by: Pauletta Browns  Exercises Standing Lumbar Extension at Wall - Forearms - 5 x daily - 7 x weekly - 1 sets - 5 reps - 3 seconds hold Standing Scapular Retraction - 5 x daily - 7 x weekly - 1 sets - 5 reps - 5 second hold Single Knee to Chest Stretch - 2-3 x daily - 7 x weekly - 1 sets - 5 reps - 20 seconds hold Supine Gluteus Stretch - 2-3 x daily - 7 x weekly - 1 sets - 5 reps - 20 seconds hold

## 2020-09-21 NOTE — Therapy (Signed)
[facility] Physical Therapy [address] [place], [place], [address] Phone: [phone]   Fax:  [phone]  Physical Therapy Evaluation  Patient Details  Name: Dylan Grant MRN: [id number] Date of Birth: 07/29/1951 Referring Provider (PT): Kathryne Hitch MD   Encounter Date: 09/21/2020   PT End of Session - 09/21/20 1740    Visit Number 1    Number of Visits 16    PT Start Time 1345    PT Stop Time 1428    PT Time Calculation (min) 43 min    Activity Tolerance Patient tolerated treatment well;No increased pain    Behavior During Therapy WFL for tasks assessed/performed           Past Medical History:  Diagnosis Date  . Allergy   . Cancer (HCC) 2010   kidney  . Chronic kidney disease    kidney disease  . Diabetes mellitus without complication (HCC)    prediabetes  . GERD (gastroesophageal reflux disease)   . Hyperlipidemia   . Hypertension     Past Surgical History:  Procedure Laterality Date  . ANAL FISSURE REPAIR    . CATARACT EXTRACTION Bilateral 2012  . NEPHRECTOMY Left 2010   kidney cancer    There were no vitals filed for this visit.    Subjective Assessment - 09/21/20 1736    Subjective Dylan Grant has had low back and hip pain for over a year.  Symptoms have not been improving and he is looking to resolve low back, B hip and R sciatica to the knee.    Pertinent History DM, HTN, history of nephrectomy    Limitations Sitting;House hold activities;Lifting;Standing;Walking    How long can you sit comfortably? 15 minutes    How long can you stand comfortably? Can be 30+ minutes    How long can you walk comfortably? 2-3 miles    Patient Stated Goals Reduce pain and return to long (5-6 mile) walks without pain.    Currently in Pain? Yes    Pain Score 5     Pain Location Other (Comment)   Back, B hips and R leg to the knee   Pain Orientation Left;Right;Lower    Pain Descriptors / Indicators Aching;Burning;Sore;Tender;Tightness     Pain Type Chronic pain    Pain Radiating Towards R knee    Pain Onset More than a month ago    Pain Frequency Intermittent    Aggravating Factors  Prolonged postures    Pain Relieving Factors Change of position and moderate activity (walking 1-2 miles)    Effect of Pain on Daily Activities Limits endurance with most ADLs              [facility] PT Assessment - 09/21/20 0001      Assessment   Medical Diagnosis Low back and B hip pain with R sciatica to the knee    Referring Provider (PT) Kathryne Hitch MD    Onset Date/Surgical Date --   ~ 2 years     Balance Screen   Has the patient fallen in the past 6 months No    Has the patient had a decrease in activity level because of a fear of falling?  No    Is the patient reluctant to leave their home because of a fear of falling?  No      Prior Function   Level of Independence Independent      Cognition   Overall Cognitive Status Within Functional Limits for tasks assessed  Observation/Other Assessments   Focus on Therapeutic Outcomes (FOTO)  59 (67 Goal)      ROM / Strength   AROM / PROM / Strength AROM;Strength      AROM   Overall AROM  Deficits    AROM Assessment Site Lumbar;Hip    Right/Left Hip Left;Right    Right Hip Flexion 90    Right Hip External Rotation  45    Right Hip Internal Rotation  -6    Left Hip Flexion 90    Left Hip External Rotation  48    Left Hip Internal Rotation  -3    Lumbar Extension 10      Flexibility   Soft Tissue Assessment /Muscle Length yes    Hamstrings 45 degrees B                      Objective measurements completed on examination: See above findings.       [place] Adult PT Treatment/Exercise - 09/21/20 0001      Posture/Postural Control   Posture/Postural Control Postural limitations    Postural Limitations Forward head;Rounded Shoulders;Decreased lumbar lordosis      Therapeutic Activites    Therapeutic Activities ADL's    ADL's Reviewed anatomy,  posture, log roll and the importance of avoiding flexion AROM.      Exercises   Exercises Lumbar      Lumbar Exercises: Stretches   Single Knee to Chest Stretch Left;Right;4 reps;20 seconds    Other Lumbar Stretch Exercise Gluteal stretch (knee to opposite shoulder) 4X 20 seconds      Lumbar Exercises: Standing   Other Standing Lumbar Exercises Trunk extension AROM 10X 3 seconds    Other Standing Lumbar Exercises Shoulder blade pinches 10X 5 seconds                  PT Education - 09/21/20 1740    Education Details Reviewed posture, spine anatomy, log roll and imaging.  Reviewed starter HEP.    Person(s) Educated Patient    Methods Explanation;Demonstration;Verbal cues;Handout    Comprehension Verbal cues required;Need further instruction;Returned demonstration;Verbalized understanding               PT Long Term Goals - 09/21/20 1744      PT LONG TERM GOAL #1   Title Improve FOTO to 67.    Baseline 59    Time 8    Period Weeks    Status New    Target Date 11/25/20      PT LONG TERM GOAL #2   Title Improve trunk AROM to 20 degrees.    Baseline 10 degrees    Time 8    Period Weeks    Status New    Target Date 11/25/20      PT LONG TERM GOAL #3   Title Improve B hip AROM for flexion to 100 and IR to 0 degrees.    Baseline See objective.    Time 8    Period Weeks    Status New    Target Date 11/25/20      PT LONG TERM GOAL #4   Title Dylan Grant will be independent with his long-term HEP at [place].    Time 8    Period Weeks    Status New    Target Date 11/25/20                  Plan - 09/21/20 1741    Clinical Impression Statement Dylan Grant  has had B hip and low back pain for over a year.  Sciatica on the R side has also appeared.  He is an avid walker but has cut his walks down from 5-6 miles to 1-2 miles due to his symptoms.  Postural correction, body mechanics, B hip and low back stretching and core strengthening will benefit Dylan Grant and help him meet  LTGs established today.    Personal Factors and Comorbidities Comorbidity 1    Comorbidities DM, HTN    Examination-Activity Limitations Sit;Sleep;Bend;Lift;Locomotion Level    Examination-Participation Restrictions Community Activity    Stability/Clinical Decision Making Stable/Uncomplicated    Clinical Decision Making Low    Rehab Potential Good    PT Frequency 2x / week    PT Duration 8 weeks    PT Treatment/Interventions ADLs/Self Care Home Management;Cryotherapy;Traction;Therapeutic activities;Therapeutic exercise;Neuromuscular re-education;Patient/family education;Manual techniques;Dry needling    PT Next Visit Plan Review HEP.  Progress posture and body mechanics education.  Start core strengthening.    PT Home Exercise Plan Access Code: [email or web address]    Consulted and Agree with Plan of Care Patient           Patient will benefit from skilled therapeutic intervention in order to improve the following deficits and impairments:  Decreased activity tolerance,Decreased endurance,Decreased range of motion,Decreased strength,Difficulty walking,Impaired flexibility,Increased muscle spasms,Improper body mechanics,Postural dysfunction,Pain  Visit Diagnosis: Abnormal posture  Chronic bilateral low back pain with right-sided sciatica  Stiffness of right hip, not elsewhere classified  Stiffness of left hip, not elsewhere classified     Problem List Patient Active Problem List   Diagnosis Date Noted  . Unilateral primary osteoarthritis, left hip 04/26/2020  . Hypertension 11/28/2018  . Uncontrolled type 2 diabetes mellitus with hyperglycemia ([place]) 11/28/2018  . Hypertriglyceridemia 11/28/2018  . Atypical chest pain 01/10/2014  . Chest pain 01/10/2014    Farley Ly PT, MPT 09/21/2020, 5:48 PM  [facility] Physical Therapy [address] [place], [place], [address] Phone: [phone]   Fax:  [phone]  Name: Dylan Grant MRN: [id number] Date of Birth:  02/09/1951

## 2020-10-03 ENCOUNTER — Other Ambulatory Visit: Payer: Self-pay

## 2020-10-03 ENCOUNTER — Ambulatory Visit: Payer: Medicare HMO | Admitting: Orthopaedic Surgery

## 2020-10-03 DIAGNOSIS — E119 Type 2 diabetes mellitus without complications: Secondary | ICD-10-CM | POA: Diagnosis not present

## 2020-10-03 DIAGNOSIS — E782 Mixed hyperlipidemia: Secondary | ICD-10-CM | POA: Diagnosis not present

## 2020-10-03 DIAGNOSIS — I10 Essential (primary) hypertension: Secondary | ICD-10-CM | POA: Diagnosis not present

## 2020-10-03 DIAGNOSIS — D4120 Neoplasm of uncertain behavior of unspecified ureter: Secondary | ICD-10-CM | POA: Diagnosis not present

## 2020-10-03 DIAGNOSIS — D4100 Neoplasm of uncertain behavior of unspecified kidney: Secondary | ICD-10-CM | POA: Diagnosis not present

## 2020-10-03 DIAGNOSIS — N183 Chronic kidney disease, stage 3 unspecified: Secondary | ICD-10-CM | POA: Diagnosis not present

## 2020-10-03 DIAGNOSIS — K219 Gastro-esophageal reflux disease without esophagitis: Secondary | ICD-10-CM | POA: Diagnosis not present

## 2020-10-03 DIAGNOSIS — E7800 Pure hypercholesterolemia, unspecified: Secondary | ICD-10-CM | POA: Diagnosis not present

## 2020-10-03 DIAGNOSIS — N400 Enlarged prostate without lower urinary tract symptoms: Secondary | ICD-10-CM | POA: Diagnosis not present

## 2020-10-03 DIAGNOSIS — N401 Enlarged prostate with lower urinary tract symptoms: Secondary | ICD-10-CM | POA: Diagnosis not present

## 2020-10-11 ENCOUNTER — Encounter: Payer: Medicare HMO | Admitting: Rehabilitative and Restorative Service Providers"

## 2020-10-13 ENCOUNTER — Ambulatory Visit: Payer: Medicare HMO | Admitting: Orthopaedic Surgery

## 2020-10-14 ENCOUNTER — Encounter: Payer: Medicare HMO | Admitting: Rehabilitative and Restorative Service Providers"

## 2020-10-18 ENCOUNTER — Encounter: Payer: Self-pay | Admitting: Rehabilitative and Restorative Service Providers"

## 2020-10-18 ENCOUNTER — Other Ambulatory Visit: Payer: Self-pay

## 2020-10-18 ENCOUNTER — Ambulatory Visit: Payer: Medicare HMO | Admitting: Rehabilitative and Restorative Service Providers"

## 2020-10-18 DIAGNOSIS — M5441 Lumbago with sciatica, right side: Secondary | ICD-10-CM | POA: Diagnosis not present

## 2020-10-18 DIAGNOSIS — M25652 Stiffness of left hip, not elsewhere classified: Secondary | ICD-10-CM | POA: Diagnosis not present

## 2020-10-18 DIAGNOSIS — R293 Abnormal posture: Secondary | ICD-10-CM | POA: Diagnosis not present

## 2020-10-18 DIAGNOSIS — G8929 Other chronic pain: Secondary | ICD-10-CM

## 2020-10-18 DIAGNOSIS — M25651 Stiffness of right hip, not elsewhere classified: Secondary | ICD-10-CM

## 2020-10-18 DIAGNOSIS — M6281 Muscle weakness (generalized): Secondary | ICD-10-CM

## 2020-10-18 NOTE — Therapy (Signed)
[facility] Physical Therapy [address] [place], [place], [address] Phone: [phone]   Fax:  [phone]  Physical Therapy Treatment  Patient Details  Name: Dylan Grant MRN: [id number] Date of Birth: 02-27-1951 Referring Provider (PT): Mcarthur Rossetti MD   Encounter Date: 10/18/2020   PT End of Session - 10/18/20 1344    Visit Number 2    Number of Visits 16    Date for PT Re-Evaluation 11/25/20    Progress Note Due on Visit 10    PT Start Time 1517    PT Stop Time 1424    PT Time Calculation (min) 39 min    Activity Tolerance Patient tolerated treatment well    Behavior During Therapy [facility] for tasks assessed/performed           Past Medical History:  Diagnosis Date  . Allergy   . Cancer ([place]) 2010   kidney  . Chronic kidney disease    kidney disease  . Diabetes mellitus without complication (HCC)    prediabetes  . GERD (gastroesophageal reflux disease)   . Hyperlipidemia   . Hypertension     Past Surgical History:  Procedure Laterality Date  . ANAL FISSURE REPAIR    . CATARACT EXTRACTION Bilateral 2012  . NEPHRECTOMY Left 2010   kidney cancer    There were no vitals filed for this visit.   Subjective Assessment - 10/18/20 1347    Subjective Pt. indicated he felt like his back felt better than previous c less tenderness.  Pt. indicated still having pain in front of Rt leg but less common.  Improvement in night time symptoms as well.    Pertinent History DM, HTN, history of nephrectomy    Limitations Sitting;House hold activities;Lifting;Standing;Walking    How long can you sit comfortably? 15 minutes    How long can you stand comfortably? Can be 30+ minutes    How long can you walk comfortably? 2-3 miles    Patient Stated Goals Reduce pain and return to long (5-6 mile) walks without pain.    Currently in Pain? No/denies   no pain upon arrival   Pain Onset More than a month ago              [facility] PT Assessment -  10/18/20 0001      AROM   Right Hip Internal Rotation  10    Left Hip Internal Rotation  10    Lumbar Flexion to patella, no complaints    Lumbar Extension 50% WFL c end range lumbar discomfort      Flexibility   Soft Tissue Assessment /Muscle Length yes    Quadriceps Thomas test + on Rt for rectus femoris tightness                         [facility] Adult PT Treatment/Exercise - 10/18/20 0001      Lumbar Exercises: Stretches   Other Lumbar Stretch Exercise thomas test stretch Rt 30 sec x 3    Other Lumbar Stretch Exercise supine figure 4 stretch 30 sec x 2 Rt LE      Lumbar Exercises: Aerobic   Recumbent Bike Lvl 3 10 mins      Lumbar Exercises: Standing   Other Standing Lumbar Exercises lumbar extension x 10 in standing    Other Standing Lumbar Exercises scap retraction x 10      Lumbar Exercises: Supine   Bridge 20 reps;Compliant   2 sec  Manual Therapy   Manual therapy comments compression c movement to Lt upper trap                       PT Long Term Goals - 09/21/20 1744      PT LONG TERM GOAL #1   Title Improve FOTO to 67.    Baseline 59    Time 8    Period Weeks    Status New    Target Date 11/25/20      PT LONG TERM GOAL #2   Title Improve trunk AROM to 20 degrees.    Baseline 10 degrees    Time 8    Period Weeks    Status New    Target Date 11/25/20      PT LONG TERM GOAL #3   Title Improve B hip AROM for flexion to 100 and IR to 0 degrees.    Baseline See objective.    Time 8    Period Weeks    Status New    Target Date 11/25/20      PT LONG TERM GOAL #4   Title [place] will be independent with his long-term HEP at [place].    Time 8    Period Weeks    Status New    Target Date 11/25/20                 Plan - 10/18/20 1402    Clinical Impression Statement Lumbar symptoms progressing to better per report.  Lumbar mobility still limited upon assessmnet as well as hip mobility.  No centralization or  peripherialization noted c lumbar movement.  Pt. did report concordant symptoms in Lt cervical region c palpation to Lt upper trap at this time as well as pain production c cervical retraction in sitting.    Personal Factors and Comorbidities Comorbidity 1    Comorbidities DM, HTN    Examination-Activity Limitations Sit;Sleep;Bend;Lift;Locomotion Level    Examination-Participation Restrictions Community Activity    Stability/Clinical Decision Making Stable/Uncomplicated    Rehab Potential Good    PT Frequency 2x / week    PT Duration 8 weeks    PT Treatment/Interventions ADLs/Self Care Home Management;Cryotherapy;Traction;Therapeutic activities;Therapeutic exercise;Neuromuscular re-education;Patient/family education;Manual techniques;Dry needling    PT Next Visit Plan Promote improved lumbar and hip mobility/strength.    PT Home Exercise Plan Access Code: [email or web address]    Consulted and Agree with Plan of Care Patient           Patient will benefit from skilled therapeutic intervention in order to improve the following deficits and impairments:  Decreased activity tolerance,Decreased endurance,Decreased range of motion,Decreased strength,Difficulty walking,Impaired flexibility,Increased muscle spasms,Improper body mechanics,Postural dysfunction,Pain  Visit Diagnosis: Chronic bilateral low back pain with right-sided sciatica  Abnormal posture  Muscle weakness (generalized)  Stiffness of right hip, not elsewhere classified  Stiffness of left hip, not elsewhere classified     Problem List Patient Active Problem List   Diagnosis Date Noted  . Unilateral primary osteoarthritis, left hip 04/26/2020  . Hypertension 11/28/2018  . Uncontrolled type 2 diabetes mellitus with hyperglycemia ([place]) 11/28/2018  . Hypertriglyceridemia 11/28/2018  . Atypical chest pain 01/10/2014  . Chest pain 01/10/2014    Scot Jun, PT, DPT, OCS, ATC 10/18/20  2:18 PM    [facility]  Physical Therapy [address] [place], [place], [address] Phone: [phone]   Fax:  [phone]  Name: Dylan Grant MRN: [id number] Date of Birth: May 28, 1951

## 2020-10-18 NOTE — Patient Instructions (Signed)
Access Code: [email or web address] URL: [email or web address] Date: 10/18/2020 Prepared by: Scot Jun  Exercises Standing Lumbar Extension at [place] 5 x daily - 7 x weekly - 1 sets - 5 reps - 3 seconds hold Standing Scapular Retraction - 5 x daily - 7 x weekly - 1 sets - 5 reps - 5 second hold Single Knee to Chest Stretch - 2-3 x daily - 7 x weekly - 1 sets - 5 reps - 20 seconds hold Supine Gluteus Stretch - 2-3 x daily - 7 x weekly - 1 sets - 5 reps - 20 seconds hold Hip Flexor Stretch at Edge of Bed - 2 x daily - 7 x weekly - 1 sets - 5 reps - 30 hold Clamshell - 1 x daily - 7 x weekly - 3 sets - 10 reps Clamshell - 1 x daily - 7 x weekly - 3 sets - 10 reps Sidelying Reverse Clamshell - 1 x daily - 7 x weekly - 3 sets - 10 reps

## 2020-10-21 ENCOUNTER — Other Ambulatory Visit: Payer: Self-pay

## 2020-10-21 ENCOUNTER — Encounter: Payer: Self-pay | Admitting: Rehabilitative and Restorative Service Providers"

## 2020-10-21 ENCOUNTER — Ambulatory Visit: Payer: Medicare HMO | Admitting: Rehabilitative and Restorative Service Providers"

## 2020-10-21 DIAGNOSIS — G8929 Other chronic pain: Secondary | ICD-10-CM | POA: Diagnosis not present

## 2020-10-21 DIAGNOSIS — M6281 Muscle weakness (generalized): Secondary | ICD-10-CM

## 2020-10-21 DIAGNOSIS — M5441 Lumbago with sciatica, right side: Secondary | ICD-10-CM | POA: Diagnosis not present

## 2020-10-21 DIAGNOSIS — R293 Abnormal posture: Secondary | ICD-10-CM

## 2020-10-21 DIAGNOSIS — R262 Difficulty in walking, not elsewhere classified: Secondary | ICD-10-CM

## 2020-10-21 NOTE — Therapy (Signed)
[facility] [address] [place], [place], [address] Phone: [phone]   Fax:  [phone]  Physical Therapy Treatment  Patient Details  Name: Dylan Grant MRN: [id number] Date of Birth: Apr 14, 1951 Referring Provider (PT): Mcarthur Rossetti MD   Encounter Date: 10/21/2020   PT End of Session - 10/21/20 1652    Visit Number 3    Number of Visits 16    Date for PT Re-Evaluation 11/25/20    Progress Note Due on Visit 10    PT Start Time 3664    PT Stop Time 1430    PT Time Calculation (min) 45 min    Activity Tolerance Patient tolerated treatment well;No increased pain    Behavior During Therapy WFL for tasks assessed/performed           Past Medical History:  Diagnosis Date  . Allergy   . Cancer ([place]) 2010   kidney  . Chronic kidney disease    kidney disease  . Diabetes mellitus without complication (HCC)    prediabetes  . GERD (gastroesophageal reflux disease)   . Hyperlipidemia   . Hypertension     Past Surgical History:  Procedure Laterality Date  . ANAL FISSURE REPAIR    . CATARACT EXTRACTION Bilateral 2012  . NEPHRECTOMY Left 2010   kidney cancer    There were no vitals filed for this visit.   Subjective Assessment - 10/21/20 1650    Subjective Asael notes significant progress in his low back and R hip pain.  R leg pain is also "40%" of what it was at evaluation.    Pertinent History DM, HTN, history of nephrectomy    Limitations Sitting;House hold activities;Lifting;Standing;Walking    How long can you sit comfortably? 15 minutes    How long can you stand comfortably? Can be 30+ minutes    How long can you walk comfortably? 2-3 miles    Patient Stated Goals Reduce pain and return to long (5-6 mile) walks without pain.    Currently in Pain? No/denies    Pain Onset More than a month ago                             [facility] Adult PT Treatment/Exercise - 10/21/20 0001      Posture/Postural  Control   Posture/Postural Control Postural limitations    Postural Limitations Forward head;Rounded Shoulders;Decreased lumbar lordosis      Therapeutic Activites    Therapeutic Activities ADL's    ADL's Log roll for bed mobility, postural cues      Exercises   Exercises Lumbar      Lumbar Exercises: Stretches   Single Knee to Chest Stretch Left;Right;4 reps;20 seconds    Single Knee to Chest Stretch Limitations Supine and standing (4X 20 seconds each)    Other Lumbar Stretch Exercise Gluteal stretch (knee to opposite shoulder) 4X 20 seconds      Lumbar Exercises: Standing   Scapular Retraction Strengthening    Scapular Retraction Limitations Shoulder blade pinches 10X 5 seconds    Other Standing Lumbar Exercises Trunk extension AROM 10X 3 seconds    Other Standing Lumbar Exercises Alternating hip hike in doorway 2 sets of 10 for 3 seconds      Lumbar Exercises: Prone   Straight Leg Raise 10 reps;3 seconds;Other (comment)    Straight Leg Raises Limitations 2 sets of 10 prone alternating hip extensions  PT Education - 10/21/20 1651    Education Details Spent time discussing posture and reviewed log roll technique for bed mobility.    Person(s) Educated Patient    Methods Explanation;Demonstration;Verbal cues    Comprehension Verbalized understanding;Returned demonstration;Need further instruction;Verbal cues required               PT Long Term Goals - 10/21/20 1652      PT LONG TERM GOAL #1   Title Improve FOTO to 67.    Baseline 59    Time 8    Period Weeks    Status On-going      PT LONG TERM GOAL #2   Title Improve trunk AROM to 20 degrees.    Baseline 10 degrees    Time 8    Period Weeks    Status On-going      PT LONG TERM GOAL #3   Title Improve B hip AROM for flexion to 100 and IR to 0 degrees.    Baseline See objective.    Time 8    Period Weeks    Status On-going      PT LONG TERM GOAL #4   Title Alexander will be  independent with his long-term HEP at [place].    Time 8    Period Weeks    Status On-going                 Plan - 10/21/20 1653    Clinical Impression Statement Durrell is doing significantly better than he was at evaluation a month ago.  Remaining R leg symptoms are being addressed with postural education and core strengthening.  His prognosis to meet long-term goals in 2-4 weeks is good with continued work.    Personal Factors and Comorbidities Comorbidity 1    Comorbidities DM, HTN    Examination-Activity Limitations Sit;Sleep;Bend;Lift;Locomotion Level    Examination-Participation Restrictions Community Activity    Stability/Clinical Decision Making Stable/Uncomplicated    Rehab Potential Good    PT Frequency 2x / week    PT Duration 8 weeks    PT Treatment/Interventions ADLs/Self Care Home Management;Cryotherapy;Traction;Therapeutic activities;Therapeutic exercise;Neuromuscular re-education;Patient/family education;Manual techniques;Dry needling    PT Next Visit Plan Promote improved lumbar and hip mobility/strength.    PT Home Exercise Plan Access Code: [email or web address]    Consulted and Agree with Plan of Care Patient           Patient will benefit from skilled therapeutic intervention in order to improve the following deficits and impairments:  Decreased activity tolerance,Decreased endurance,Decreased range of motion,Decreased strength,Difficulty walking,Impaired flexibility,Increased muscle spasms,Improper body mechanics,Postural dysfunction,Pain  Visit Diagnosis: Abnormal posture  Muscle weakness (generalized)  Chronic bilateral low back pain with right-sided sciatica  Difficulty in walking, not elsewhere classified     Problem List Patient Active Problem List   Diagnosis Date Noted  . Unilateral primary osteoarthritis, left hip 04/26/2020  . Hypertension 11/28/2018  . Uncontrolled type 2 diabetes mellitus with hyperglycemia ([place]) 11/28/2018  . Hypertriglyceridemia  11/28/2018  . Atypical chest pain 01/10/2014  . Chest pain 01/10/2014    Farley Ly PT, MPT 10/21/2020, 4:56 PM  [facility] Physical Therapy [address] [place], [place], [address] Phone: [phone]   Fax:  [phone]  Name: Dylan Grant MRN: [id number] Date of Birth: [birth year]

## 2020-10-21 NOTE — Patient Instructions (Signed)
Access Code: [email or web address] URL: [email or web address] Date: 10/21/2020 Prepared by: Vista Mink  Exercises Standing Lumbar Extension at [place] 5 x daily - 7 x weekly - 1 sets - 5 reps - 3 seconds hold Standing Scapular Retraction - 5 x daily - 7 x weekly - 1 sets - 5 reps - 5 second hold Single Knee to Chest Stretch - 2 x daily - 7 x weekly - 1 sets - 5 reps - 20 seconds hold Supine Gluteus Stretch - 2 x daily - 7 x weekly - 1 sets - 5 reps - 20 seconds hold Standing Hip Hiking - 1 x daily - 7 x weekly - 2 sets - 10 reps - 3 seconds hold Prone Hip Extension - 1 x daily - 7 x weekly - 2 sets - 10 reps - 3 seconds hold Hip Flexor Stretch with Chair - 2 x daily - 7 x weekly - 1 sets - 5 reps - 20 seconds hold

## 2020-10-25 ENCOUNTER — Other Ambulatory Visit: Payer: Self-pay

## 2020-10-25 ENCOUNTER — Encounter: Payer: Self-pay | Admitting: Rehabilitative and Restorative Service Providers"

## 2020-10-25 ENCOUNTER — Ambulatory Visit: Payer: Medicare HMO | Admitting: Rehabilitative and Restorative Service Providers"

## 2020-10-25 DIAGNOSIS — M6281 Muscle weakness (generalized): Secondary | ICD-10-CM

## 2020-10-25 DIAGNOSIS — M5441 Lumbago with sciatica, right side: Secondary | ICD-10-CM | POA: Diagnosis not present

## 2020-10-25 DIAGNOSIS — G8929 Other chronic pain: Secondary | ICD-10-CM | POA: Diagnosis not present

## 2020-10-25 DIAGNOSIS — R262 Difficulty in walking, not elsewhere classified: Secondary | ICD-10-CM | POA: Diagnosis not present

## 2020-10-25 DIAGNOSIS — M25651 Stiffness of right hip, not elsewhere classified: Secondary | ICD-10-CM | POA: Diagnosis not present

## 2020-10-25 DIAGNOSIS — R293 Abnormal posture: Secondary | ICD-10-CM

## 2020-10-25 DIAGNOSIS — M25652 Stiffness of left hip, not elsewhere classified: Secondary | ICD-10-CM

## 2020-10-25 NOTE — Therapy (Signed)
[facility] Physical Therapy [address] [place], [place], [address] Phone: [phone]   Fax:  [phone]  Physical Therapy Treatment  Patient Details  Name: JANAI BRANNIGAN MRN: [id number] Date of Birth: [birth year] Referring Provider (PT): Mcarthur Rossetti MD   Encounter Date: 10/25/2020   PT End of Session - 10/25/20 1353    Visit Number 4    Number of Visits 16    Date for PT Re-Evaluation 11/25/20    Progress Note Due on Visit 10    PT Start Time 2229    PT Stop Time 1424    PT Time Calculation (min) 39 min    Activity Tolerance Patient tolerated treatment well    Behavior During Therapy [facility] for tasks assessed/performed           Past Medical History:  Diagnosis Date  . Allergy   . Cancer ([place]) 2010   kidney  . Chronic kidney disease    kidney disease  . Diabetes mellitus without complication (HCC)    prediabetes  . GERD (gastroesophageal reflux disease)   . Hyperlipidemia   . Hypertension     Past Surgical History:  Procedure Laterality Date  . ANAL FISSURE REPAIR    . CATARACT EXTRACTION Bilateral 2012  . NEPHRECTOMY Left 2010   kidney cancer    There were no vitals filed for this visit.   Subjective Assessment - 10/25/20 1357    Subjective Pt. indicated feeling some soreness in Rt hip/thigh that he mentioned was more like workout soreness vs. pain symptoms.    Pertinent History DM, HTN, history of nephrectomy    Limitations Sitting;House hold activities;Lifting;Standing;Walking    How long can you sit comfortably? 15 minutes    How long can you stand comfortably? Can be 30+ minutes    How long can you walk comfortably? 2-3 miles    Patient Stated Goals Reduce pain and return to long (5-6 mile) walks without pain.    Currently in Pain? Other (Comment)   soreness   Pain Location Leg    Pain Orientation Right;Upper    Pain Descriptors / Indicators Sore    Pain Onset More than a month ago    Pain Frequency Intermittent     Aggravating Factors  post workout soreness                             [facility] Adult PT Treatment/Exercise - 10/25/20 0001      Lumbar Exercises: Stretches   Other Lumbar Stretch Exercise Thomas stretch supine 30 sec x 3    Other Lumbar Stretch Exercise SK to opposite shoulder 30 sec x 3 Rt, piriformis figure 4 supine 30 sec x 3 Rt Le      Lumbar Exercises: Aerobic   Nustep Lvl 5 10 mins      Lumbar Exercises: Machines for Strengthening   Cybex Knee Extension DL 2 x 10 control focus 20 bls    Cybex Knee Flexion DL 2 x 10 control focus 25 lbs    Other Lumbar Machine Exercise rows machine 2 x10 25 lbs                       PT Long Term Goals - 10/21/20 1652      PT LONG TERM GOAL #1   Title Improve FOTO to 67.    Baseline 59    Time 8    Period Weeks  Status On-going      PT LONG TERM GOAL #2   Title Improve trunk AROM to 20 degrees.    Baseline 10 degrees    Time 8    Period Weeks    Status On-going      PT LONG TERM GOAL #3   Title Improve B hip AROM for flexion to 100 and IR to 0 degrees.    Baseline See objective.    Time 8    Period Weeks    Status On-going      PT LONG TERM GOAL #4   Title Kiran will be independent with his long-term HEP at [place].    Time 8    Period Weeks    Status On-going                 Plan - 10/25/20 1404    Clinical Impression Statement Due to improvements in overall pain symptoms, Pt. expressed desire to trial some gym based activity for possible use outside of clinic.  Performed leg extension, curls and rows c good technique.  Continued skilled Pt indicated to continue overall progression towards goals and successful transitioning to HEP when appropriate based off symptoms.    Personal Factors and Comorbidities Comorbidity 1    Comorbidities DM, HTN    Examination-Activity Limitations Sit;Sleep;Bend;Lift;Locomotion Level    Examination-Participation Restrictions Community Activity     Stability/Clinical Decision Making Stable/Uncomplicated    Rehab Potential Good    PT Frequency 2x / week    PT Duration 8 weeks    PT Treatment/Interventions ADLs/Self Care Home Management;Cryotherapy;Traction;Therapeutic activities;Therapeutic exercise;Neuromuscular re-education;Patient/family education;Manual techniques;Dry needling    PT Next Visit Plan Continue review for home exercise progression, hip/lumbar mobility gains    PT Home Exercise Plan Access Code: [email or web address]    Consulted and Agree with Plan of Care Patient           Patient will benefit from skilled therapeutic intervention in order to improve the following deficits and impairments:  Decreased activity tolerance,Decreased endurance,Decreased range of motion,Decreased strength,Difficulty walking,Impaired flexibility,Increased muscle spasms,Improper body mechanics,Postural dysfunction,Pain  Visit Diagnosis: Chronic bilateral low back pain with right-sided sciatica  Abnormal posture  Muscle weakness (generalized)  Difficulty in walking, not elsewhere classified  Stiffness of right hip, not elsewhere classified  Stiffness of left hip, not elsewhere classified     Problem List Patient Active Problem List   Diagnosis Date Noted  . Unilateral primary osteoarthritis, left hip 04/26/2020  . Hypertension 11/28/2018  . Uncontrolled type 2 diabetes mellitus with hyperglycemia ([place]) 11/28/2018  . Hypertriglyceridemia 11/28/2018  . Atypical chest pain 01/10/2014  . Chest pain 01/10/2014    Scot Jun, PT, DPT, OCS, ATC 10/25/20  2:15 PM    [facility] [address] [place], [place], [address] Phone: [phone]   Fax:  [phone]  Name: MAKAYLA CONFER MRN: [id number] Date of Birth: 04-Nov-1950

## 2020-10-28 ENCOUNTER — Ambulatory Visit: Payer: Medicare HMO | Admitting: Rehabilitative and Restorative Service Providers"

## 2020-10-28 ENCOUNTER — Encounter: Payer: Self-pay | Admitting: Rehabilitative and Restorative Service Providers"

## 2020-10-28 ENCOUNTER — Other Ambulatory Visit: Payer: Self-pay

## 2020-10-28 DIAGNOSIS — R262 Difficulty in walking, not elsewhere classified: Secondary | ICD-10-CM | POA: Diagnosis not present

## 2020-10-28 DIAGNOSIS — M25651 Stiffness of right hip, not elsewhere classified: Secondary | ICD-10-CM | POA: Diagnosis not present

## 2020-10-28 DIAGNOSIS — R293 Abnormal posture: Secondary | ICD-10-CM | POA: Diagnosis not present

## 2020-10-28 DIAGNOSIS — G8929 Other chronic pain: Secondary | ICD-10-CM | POA: Diagnosis not present

## 2020-10-28 DIAGNOSIS — M25652 Stiffness of left hip, not elsewhere classified: Secondary | ICD-10-CM

## 2020-10-28 DIAGNOSIS — M5441 Lumbago with sciatica, right side: Secondary | ICD-10-CM | POA: Diagnosis not present

## 2020-10-28 NOTE — Patient Instructions (Signed)
Access Code: [email or web address] URL: [email or web address] Date: 10/28/2020 Prepared by: Scot Jun  Exercises Standing Lumbar Extension at [place] 5 x daily - 7 x weekly - 1 sets - 5 reps - 3 seconds hold Single Knee to Chest Stretch - 2 x daily - 7 x weekly - 1 sets - 5 reps - 20 seconds hold Supine Gluteus Stretch - 2 x daily - 7 x weekly - 1 sets - 5 reps - 20 seconds hold Standing Hip Hiking - 1 x daily - 7 x weekly - 2 sets - 10 reps - 3 seconds hold Prone Hip Extension - 1 x daily - 7 x weekly - 2 sets - 10 reps - 3 seconds hold Shoulder External Rotation and Scapular Retraction with Resistance - 1 x daily - 7 x weekly - 3 sets - 10 reps Supine Chin Tuck - 1 x daily - 7 x weekly - 1 sets - 10-15 reps - 5 hold Thomas Stretch - 2 x daily - 7 x weekly - 1 sets - 5 reps - 20-30 hold

## 2020-10-28 NOTE — Therapy (Signed)
[facility] Physical Therapy [address] [place], [place], [address] Phone: [phone]   Fax:  [phone]  Physical Therapy Treatment  Patient Details  Name: Dylan Grant MRN: [id number] Date of Birth: 04-Feb-1951 Referring Provider (PT): Mcarthur Rossetti MD   Encounter Date: 10/28/2020   PT End of Session - 10/28/20 1351    Visit Number 5    Number of Visits 16    Date for PT Re-Evaluation 11/25/20    Progress Note Due on Visit 10    PT Start Time 1342    PT Stop Time 1425    PT Time Calculation (min) 43 min    Activity Tolerance Patient tolerated treatment well    Behavior During Therapy [facility] for tasks assessed/performed           Past Medical History:  Diagnosis Date  . Allergy   . Cancer ([place]) 2010   kidney  . Chronic kidney disease    kidney disease  . Diabetes mellitus without complication (HCC)    prediabetes  . GERD (gastroesophageal reflux disease)   . Hyperlipidemia   . Hypertension     Past Surgical History:  Procedure Laterality Date  . ANAL FISSURE REPAIR    . CATARACT EXTRACTION Bilateral 2012  . NEPHRECTOMY Left 2010   kidney cancer    There were no vitals filed for this visit.   Subjective Assessment - 10/28/20 1346    Subjective Pt. indicated sore in Rt anterior/lateral thigh that gets better with HEP/stretching.  Less complaints of pain in back and leg at night.  Pt. indicated concerns about stiffness in neck and upper back as well.    Pertinent History DM, HTN, history of nephrectomy    Limitations Sitting;House hold activities;Lifting;Standing;Walking    How long can you sit comfortably? 15 minutes    How long can you stand comfortably? Can be 30+ minutes    How long can you walk comfortably? 2-3 miles    Patient Stated Goals Reduce pain and return to long (5-6 mile) walks without pain.    Currently in Pain? Other (Comment)   sore   Pain Score 3    at worst   Pain Location Leg    Pain Orientation  Right;Upper    Pain Descriptors / Indicators Sore;Tightness    Pain Type Chronic pain    Pain Onset More than a month ago    Pain Frequency Intermittent    Aggravating Factors  inactivity causes tightness                             [facility] Adult PT Treatment/Exercise - 10/28/20 0001      Self-Care   Self-Care Other Self-Care Comments    Other Self-Care Comments  After care for needling handout provided and reviewed.  Pain monitoring scale reviewed for return to walking plan (0-2 green, 3-5 yellow, 5+ red) as coding for adaptation for next walking plan.      Lumbar Exercises: Aerobic   Nustep Lvl 6 10 mins      Lumbar Exercises: Standing   Scapular Retraction Both   scap retraction c bilateral UE ER           Trigger Point Dry Needling - 10/28/20 0001    Consent Given? Yes    Education Handout Provided Yes    Dry Needling Comments Lt upper trap c twitch response noted  PT Long Term Goals - 10/21/20 1652      PT LONG TERM GOAL #1   Title Improve FOTO to 67.    Baseline 59    Time 8    Period Weeks    Status On-going      PT LONG TERM GOAL #2   Title Improve trunk AROM to 20 degrees.    Baseline 10 degrees    Time 8    Period Weeks    Status On-going      PT LONG TERM GOAL #3   Title Improve B hip AROM for flexion to 100 and IR to 0 degrees.    Baseline See objective.    Time 8    Period Weeks    Status On-going      PT LONG TERM GOAL #4   Title Dawayne will be independent with his long-term HEP at [place].    Time 8    Period Weeks    Status On-going                 Plan - 10/28/20 1427    Clinical Impression Statement Primary complaints today addressed in adidtional HEP for upper back, scapular movmenet to help address cervicothoracic mobility limitations indicated.  Detailed return to walking plan c cues for symptom response and exercise adaptation as detailed in self care section in flow sheet.  HEP  printout and adjustment today to align intervention c Pt. preferences.   Will reassess response to adjustments next visit.    Personal Factors and Comorbidities Comorbidity 1    Comorbidities DM, HTN    Examination-Activity Limitations Sit;Sleep;Bend;Lift;Locomotion Level    Examination-Participation Restrictions Community Activity    Stability/Clinical Decision Making Stable/Uncomplicated    Rehab Potential Good    PT Frequency 2x / week    PT Duration 8 weeks    PT Treatment/Interventions ADLs/Self Care Home Management;Cryotherapy;Traction;Therapeutic activities;Therapeutic exercise;Neuromuscular re-education;Patient/family education;Manual techniques;Dry needling    PT Next Visit Plan Progress to walking plan, continue to promote improved spinal mobility, hip mobility into extension primarily.    PT Home Exercise Plan Access Code: [email or web address]    Consulted and Agree with Plan of Care Patient           Patient will benefit from skilled therapeutic intervention in order to improve the following deficits and impairments:  Decreased activity tolerance,Decreased endurance,Decreased range of motion,Decreased strength,Difficulty walking,Impaired flexibility,Increased muscle spasms,Improper body mechanics,Postural dysfunction,Pain  Visit Diagnosis: Chronic bilateral low back pain with right-sided sciatica  Abnormal posture  Difficulty in walking, not elsewhere classified  Stiffness of right hip, not elsewhere classified  Stiffness of left hip, not elsewhere classified     Problem List Patient Active Problem List   Diagnosis Date Noted  . Unilateral primary osteoarthritis, left hip 04/26/2020  . Hypertension 11/28/2018  . Uncontrolled type 2 diabetes mellitus with hyperglycemia ([place]) 11/28/2018  . Hypertriglyceridemia 11/28/2018  . Atypical chest pain 01/10/2014  . Chest pain 01/10/2014    Scot Jun, PT, DPT, OCS, ATC 10/28/20  2:32 PM    [facility] [address] [place], [place], [address] Phone: [phone]   Fax:  [phone]  Name: KRISTOFOR MICHALOWSKI MRN: [id number] Date of Birth: 16-Jun-1951

## 2020-11-01 ENCOUNTER — Ambulatory Visit: Payer: Medicare HMO | Admitting: Rehabilitative and Restorative Service Providers"

## 2020-11-01 ENCOUNTER — Other Ambulatory Visit: Payer: Self-pay

## 2020-11-01 ENCOUNTER — Encounter: Payer: Self-pay | Admitting: Rehabilitative and Restorative Service Providers"

## 2020-11-01 DIAGNOSIS — R293 Abnormal posture: Secondary | ICD-10-CM | POA: Diagnosis not present

## 2020-11-01 DIAGNOSIS — M5441 Lumbago with sciatica, right side: Secondary | ICD-10-CM

## 2020-11-01 DIAGNOSIS — M25652 Stiffness of left hip, not elsewhere classified: Secondary | ICD-10-CM

## 2020-11-01 DIAGNOSIS — G8929 Other chronic pain: Secondary | ICD-10-CM

## 2020-11-01 DIAGNOSIS — M25651 Stiffness of right hip, not elsewhere classified: Secondary | ICD-10-CM

## 2020-11-01 NOTE — Therapy (Addendum)
[facility] [address] [place], [place], [address] Phone: [phone]   Fax:  [phone]  Physical Therapy Treatment/Discharge   Patient Details  Name: Dylan Grant MRN: [id number] Date of Birth: [birth year] Referring Provider (PT): Mcarthur Rossetti MD   Encounter Date: 11/01/2020   PT End of Session - 11/01/20 1348    Visit Number 6    Number of Visits 16    Date for PT Re-Evaluation 11/25/20    Progress Note Due on Visit 10    PT Start Time 1346    PT Stop Time 1424    PT Time Calculation (min) 38 min    Activity Tolerance Patient tolerated treatment well    Behavior During Therapy [facility] for tasks assessed/performed           Past Medical History:  Diagnosis Date  . Allergy   . Cancer ([place]) 2010   kidney  . Chronic kidney disease    kidney disease  . Diabetes mellitus without complication (HCC)    prediabetes  . GERD (gastroesophageal reflux disease)   . Hyperlipidemia   . Hypertension     Past Surgical History:  Procedure Laterality Date  . ANAL FISSURE REPAIR    . CATARACT EXTRACTION Bilateral 2012  . NEPHRECTOMY Left 2010   kidney cancer    There were no vitals filed for this visit.   Subjective Assessment - 11/01/20 1358    Subjective Pt. stated neck pain seem to improve some.  Pt. indicated soreness in leg was less in front and more on lateral thigh/hip, still getting better.    Pertinent History DM, HTN, history of nephrectomy    Limitations Sitting;House hold activities;Lifting;Standing;Walking    How long can you sit comfortably? 15 minutes    How long can you stand comfortably? Can be 30+ minutes    How long can you walk comfortably? 2-3 miles    Patient Stated Goals Reduce pain and return to long (5-6 mile) walks without pain.    Currently in Pain? Other (Comment)   soreness mild   Pain Onset More than a month ago                             [facility] Adult PT Treatment/Exercise -  11/01/20 0001      Lumbar Exercises: Aerobic   UBE (Upper Arm Bike) UE/LE fwd lvl 4.5 5 mins, UE rev lvl 2.5 2 mins      Lumbar Exercises: Machines for Strengthening   Leg Press SL 3 x 10 37 lbs, performed bilateral      Lumbar Exercises: Standing   Other Standing Lumbar Exercises lateral stepping green band around calf 10 ft x 5 each way    Other Standing Lumbar Exercises doorway hip hike 3 sec hold x 10 each      Manual Therapy   Manual therapy comments compression to Lt upper trap            Trigger Point Dry Needling - 11/01/20 0001    Consent Given? Yes    Education Handout Provided Previously provided    Dry Needling Comments Lt upper trap c twitch response noted                     PT Long Term Goals - 10/21/20 1652      PT LONG TERM GOAL #1   Title Improve FOTO to 59.    Baseline  59    Time 8    Period Weeks    Status On-going      PT LONG TERM GOAL #2   Title Improve trunk AROM to 20 degrees.    Baseline 10 degrees    Time 8    Period Weeks    Status On-going      PT LONG TERM GOAL #3   Title Improve B hip AROM for flexion to 100 and IR to 0 degrees.    Baseline See objective.    Time 8    Period Weeks    Status On-going      PT LONG TERM GOAL #4   Title Ruble will be independent with his long-term HEP at [facility].    Time 8    Period Weeks    Status On-going                 Plan - 11/01/20 1513    Clinical Impression Statement Pt. continued to report reduced severity overall compared to evaluation and current presentation identifies c continued Rt LE strenght/endurance deficits compared to Lt at this time.  Continued plan of in clinic and HEP progression indicated.    Personal Factors and Comorbidities Comorbidity 1    Comorbidities DM, HTN    Examination-Activity Limitations Sit;Sleep;Bend;Lift;Locomotion Level    Examination-Participation Restrictions Community Activity    Stability/Clinical Decision Making Stable/Uncomplicated     Rehab Potential Good    PT Frequency 2x / week    PT Duration 8 weeks    PT Treatment/Interventions ADLs/Self Care Home Management;Cryotherapy;Traction;Therapeutic activities;Therapeutic exercise;Neuromuscular re-education;Patient/family education;Manual techniques;Dry needling    PT Next Visit Plan Continue to promote improved spinal mobility, hip mobility into extension, LE strengthening/endurance    PT Home Exercise Plan Access Code: [email or web address]    Consulted and Agree with Plan of Care Patient           Patient will benefit from skilled therapeutic intervention in order to improve the following deficits and impairments:  Decreased activity tolerance,Decreased endurance,Decreased range of motion,Decreased strength,Difficulty walking,Impaired flexibility,Increased muscle spasms,Improper body mechanics,Postural dysfunction,Pain  Visit Diagnosis: Chronic bilateral low back pain with right-sided sciatica  Abnormal posture  Stiffness of right hip, not elsewhere classified  Stiffness of left hip, not elsewhere classified     Problem List Patient Active Problem List   Diagnosis Date Noted  . Unilateral primary osteoarthritis, left hip 04/26/2020  . Hypertension 11/28/2018  . Uncontrolled type 2 diabetes mellitus with hyperglycemia ([place]) 11/28/2018  . Hypertriglyceridemia 11/28/2018  . Atypical chest pain 01/10/2014  . Chest pain 01/10/2014    Scot Jun, PT, DPT, OCS, ATC 11/01/20  3:15 PM  PHYSICAL THERAPY DISCHARGE SUMMARY  Visits from Start of Care: 6  Current functional level related to goals / functional outcomes: See note   Remaining deficits: See note   Education / Equipment: HEP Plan: Patient agrees to discharge.  Patient goals were partially met. Patient is being discharged due to being pleased with the current functional level.  ?????   Scot Jun, PT, DPT, OCS, ATC 12/28/20  1:34 PM       [facility] [address] [place], [place], [address] Phone: [phone]   Fax:  [phone]  Name: Dylan Grant MRN: [id number] Date of Birth: 1951-05-09

## 2020-11-02 ENCOUNTER — Encounter: Payer: Self-pay | Admitting: Orthopaedic Surgery

## 2020-11-02 ENCOUNTER — Ambulatory Visit (INDEPENDENT_AMBULATORY_CARE_PROVIDER_SITE_OTHER): Payer: Medicare HMO | Admitting: Orthopaedic Surgery

## 2020-11-02 DIAGNOSIS — M5441 Lumbago with sciatica, right side: Secondary | ICD-10-CM | POA: Diagnosis not present

## 2020-11-02 DIAGNOSIS — G8929 Other chronic pain: Secondary | ICD-10-CM | POA: Diagnosis not present

## 2020-11-02 NOTE — Progress Notes (Signed)
The patient comes in today to go over a MRI of his lumbar spine.  He has been having right-sided low back pain as well as right thigh pain.  I have evaluated him for his hips before and has had some bursitis of his hips.  He has now been going to outpatient physical therapy and he said that that is helped quite a bit.  He said the tips they have given him have helped in his home stretching program has helped as well.  He said things are easing off.  The MRI of his lumbar spine shows multifactorial degenerative changes and severe stenosis at multiple levels centrally but also some foraminal stenosis and significant facet hypertrophy at multiple levels throughout the lumbar spine.  I did share with him these findings and talk to him in detail about this.  On exam both hips move smoothly.  He is walking around more normally and has a better posture.  Right now would not recommend any other intervention for his back since he is made good progress with therapy.  If things worsen at all he will let us know.  All questions and concerns were answered and addressed.  I am fine with him seeking chiropractic treatment again for his spine if he needs it.

## 2020-11-04 ENCOUNTER — Encounter: Payer: Medicare HMO | Admitting: Rehabilitative and Restorative Service Providers"

## 2020-11-16 DIAGNOSIS — I10 Essential (primary) hypertension: Secondary | ICD-10-CM | POA: Diagnosis not present

## 2020-11-16 DIAGNOSIS — M109 Gout, unspecified: Secondary | ICD-10-CM | POA: Diagnosis not present

## 2020-11-16 DIAGNOSIS — Z0001 Encounter for general adult medical examination with abnormal findings: Secondary | ICD-10-CM | POA: Diagnosis not present

## 2020-11-16 DIAGNOSIS — K219 Gastro-esophageal reflux disease without esophagitis: Secondary | ICD-10-CM | POA: Diagnosis not present

## 2020-11-16 DIAGNOSIS — Z125 Encounter for screening for malignant neoplasm of prostate: Secondary | ICD-10-CM | POA: Diagnosis not present

## 2020-11-16 DIAGNOSIS — J301 Allergic rhinitis due to pollen: Secondary | ICD-10-CM | POA: Diagnosis not present

## 2020-11-16 DIAGNOSIS — Z7984 Long term (current) use of oral hypoglycemic drugs: Secondary | ICD-10-CM | POA: Diagnosis not present

## 2020-11-16 DIAGNOSIS — E785 Hyperlipidemia, unspecified: Secondary | ICD-10-CM | POA: Diagnosis not present

## 2020-11-16 DIAGNOSIS — Z79899 Other long term (current) drug therapy: Secondary | ICD-10-CM | POA: Diagnosis not present

## 2020-11-16 DIAGNOSIS — R001 Bradycardia, unspecified: Secondary | ICD-10-CM | POA: Diagnosis not present

## 2020-11-16 DIAGNOSIS — E119 Type 2 diabetes mellitus without complications: Secondary | ICD-10-CM | POA: Diagnosis not present

## 2020-11-16 DIAGNOSIS — E559 Vitamin D deficiency, unspecified: Secondary | ICD-10-CM | POA: Diagnosis not present

## 2020-11-30 DIAGNOSIS — D4100 Neoplasm of uncertain behavior of unspecified kidney: Secondary | ICD-10-CM | POA: Diagnosis not present

## 2020-11-30 DIAGNOSIS — D4120 Neoplasm of uncertain behavior of unspecified ureter: Secondary | ICD-10-CM | POA: Diagnosis not present

## 2020-11-30 DIAGNOSIS — K219 Gastro-esophageal reflux disease without esophagitis: Secondary | ICD-10-CM | POA: Diagnosis not present

## 2020-11-30 DIAGNOSIS — E119 Type 2 diabetes mellitus without complications: Secondary | ICD-10-CM | POA: Diagnosis not present

## 2020-11-30 DIAGNOSIS — I10 Essential (primary) hypertension: Secondary | ICD-10-CM | POA: Diagnosis not present

## 2020-11-30 DIAGNOSIS — E7800 Pure hypercholesterolemia, unspecified: Secondary | ICD-10-CM | POA: Diagnosis not present

## 2020-11-30 DIAGNOSIS — E782 Mixed hyperlipidemia: Secondary | ICD-10-CM | POA: Diagnosis not present

## 2020-11-30 DIAGNOSIS — E781 Pure hyperglyceridemia: Secondary | ICD-10-CM | POA: Diagnosis not present

## 2020-11-30 DIAGNOSIS — E785 Hyperlipidemia, unspecified: Secondary | ICD-10-CM | POA: Diagnosis not present

## 2020-11-30 DIAGNOSIS — N401 Enlarged prostate with lower urinary tract symptoms: Secondary | ICD-10-CM | POA: Diagnosis not present

## 2020-12-01 DIAGNOSIS — N189 Chronic kidney disease, unspecified: Secondary | ICD-10-CM | POA: Diagnosis not present

## 2020-12-01 DIAGNOSIS — E785 Hyperlipidemia, unspecified: Secondary | ICD-10-CM | POA: Diagnosis not present

## 2020-12-01 DIAGNOSIS — N1831 Chronic kidney disease, stage 3a: Secondary | ICD-10-CM | POA: Diagnosis not present

## 2020-12-06 DIAGNOSIS — N2581 Secondary hyperparathyroidism of renal origin: Secondary | ICD-10-CM | POA: Diagnosis not present

## 2020-12-06 DIAGNOSIS — R739 Hyperglycemia, unspecified: Secondary | ICD-10-CM | POA: Diagnosis not present

## 2020-12-06 DIAGNOSIS — E785 Hyperlipidemia, unspecified: Secondary | ICD-10-CM | POA: Diagnosis not present

## 2020-12-06 DIAGNOSIS — C649 Malignant neoplasm of unspecified kidney, except renal pelvis: Secondary | ICD-10-CM | POA: Diagnosis not present

## 2020-12-06 DIAGNOSIS — N1831 Chronic kidney disease, stage 3a: Secondary | ICD-10-CM | POA: Diagnosis not present

## 2020-12-06 DIAGNOSIS — D631 Anemia in chronic kidney disease: Secondary | ICD-10-CM | POA: Diagnosis not present

## 2020-12-06 DIAGNOSIS — I129 Hypertensive chronic kidney disease with stage 1 through stage 4 chronic kidney disease, or unspecified chronic kidney disease: Secondary | ICD-10-CM | POA: Diagnosis not present

## 2021-01-05 DIAGNOSIS — E119 Type 2 diabetes mellitus without complications: Secondary | ICD-10-CM | POA: Diagnosis not present

## 2021-01-05 DIAGNOSIS — K219 Gastro-esophageal reflux disease without esophagitis: Secondary | ICD-10-CM | POA: Diagnosis not present

## 2021-01-05 DIAGNOSIS — E781 Pure hyperglyceridemia: Secondary | ICD-10-CM | POA: Diagnosis not present

## 2021-01-05 DIAGNOSIS — N400 Enlarged prostate without lower urinary tract symptoms: Secondary | ICD-10-CM | POA: Diagnosis not present

## 2021-01-05 DIAGNOSIS — N401 Enlarged prostate with lower urinary tract symptoms: Secondary | ICD-10-CM | POA: Diagnosis not present

## 2021-01-05 DIAGNOSIS — N183 Chronic kidney disease, stage 3 unspecified: Secondary | ICD-10-CM | POA: Diagnosis not present

## 2021-01-05 DIAGNOSIS — I10 Essential (primary) hypertension: Secondary | ICD-10-CM | POA: Diagnosis not present

## 2021-01-05 DIAGNOSIS — E782 Mixed hyperlipidemia: Secondary | ICD-10-CM | POA: Diagnosis not present

## 2021-01-16 ENCOUNTER — Other Ambulatory Visit: Payer: Self-pay

## 2021-01-16 ENCOUNTER — Ambulatory Visit (AMBULATORY_SURGERY_CENTER): Payer: Medicare HMO | Admitting: *Deleted

## 2021-01-16 VITALS — Ht 68.0 in | Wt 182.0 lb

## 2021-01-16 DIAGNOSIS — Z86010 Personal history of colonic polyps: Secondary | ICD-10-CM

## 2021-01-16 MED ORDER — PEG 3350-KCL-NA BICARB-NACL 420 G PO SOLR: 4000.0000 mL | Freq: Once | ORAL | 0 refills | Status: AC

## 2021-01-16 NOTE — Progress Notes (Signed)

## 2021-01-27 DIAGNOSIS — R351 Nocturia: Secondary | ICD-10-CM | POA: Diagnosis not present

## 2021-01-27 DIAGNOSIS — C642 Malignant neoplasm of left kidney, except renal pelvis: Secondary | ICD-10-CM | POA: Diagnosis not present

## 2021-01-27 DIAGNOSIS — N401 Enlarged prostate with lower urinary tract symptoms: Secondary | ICD-10-CM | POA: Diagnosis not present

## 2021-01-30 ENCOUNTER — Ambulatory Visit (AMBULATORY_SURGERY_CENTER): Payer: Medicare HMO | Admitting: Gastroenterology

## 2021-01-30 ENCOUNTER — Other Ambulatory Visit: Payer: Self-pay

## 2021-01-30 ENCOUNTER — Encounter: Payer: Self-pay | Admitting: Gastroenterology

## 2021-01-30 ENCOUNTER — Other Ambulatory Visit: Payer: Self-pay | Admitting: Gastroenterology

## 2021-01-30 VITALS — BP 110/59 | HR 40 | Temp 97.2°F | Resp 6 | Ht 68.0 in | Wt 182.0 lb

## 2021-01-30 DIAGNOSIS — K635 Polyp of colon: Secondary | ICD-10-CM

## 2021-01-30 DIAGNOSIS — K219 Gastro-esophageal reflux disease without esophagitis: Secondary | ICD-10-CM | POA: Diagnosis not present

## 2021-01-30 DIAGNOSIS — E119 Type 2 diabetes mellitus without complications: Secondary | ICD-10-CM | POA: Diagnosis not present

## 2021-01-30 DIAGNOSIS — Z86010 Personal history of colonic polyps: Secondary | ICD-10-CM | POA: Diagnosis not present

## 2021-01-30 DIAGNOSIS — D123 Benign neoplasm of transverse colon: Secondary | ICD-10-CM

## 2021-01-30 DIAGNOSIS — I10 Essential (primary) hypertension: Secondary | ICD-10-CM | POA: Diagnosis not present

## 2021-01-30 DIAGNOSIS — E669 Obesity, unspecified: Secondary | ICD-10-CM | POA: Diagnosis not present

## 2021-01-30 MED ORDER — SODIUM CHLORIDE 0.9 % IV SOLN: 500.0000 mL | Freq: Once | INTRAVENOUS | Status: AC

## 2021-01-30 NOTE — Patient Instructions (Signed)
Resume previous diet and medications. Awaiting pathology results. Repeat colonoscopy in 3-5 years for surveillance based on path results.  YOU HAD AN ENDOSCOPIC PROCEDURE TODAY AT [facility]:   Refer to the procedure report that was given to you for any specific questions about what was found during the examination.  If the procedure report does not answer your questions, please call your gastroenterologist to clarify.  If you requested that your care partner not be given the details of your procedure findings, then the procedure report has been included in a sealed envelope for you to review at your convenience later.  YOU SHOULD EXPECT: Some feelings of bloating in the abdomen. Passage of more gas than usual.  Walking can help get rid of the air that was put into your GI tract during the procedure and reduce the bloating. If you had a lower endoscopy (such as a colonoscopy or flexible sigmoidoscopy) you may notice spotting of blood in your stool or on the toilet paper. If you underwent a bowel prep for your procedure, you may not have a normal bowel movement for a few days.  Please Note:  You might notice some irritation and congestion in your nose or some drainage.  This is from the oxygen used during your procedure.  There is no need for concern and it should clear up in a day or so.  SYMPTOMS TO REPORT IMMEDIATELY:   Following lower endoscopy (colonoscopy or flexible sigmoidoscopy):  Excessive amounts of blood in the stool  Significant tenderness or worsening of abdominal pains  Swelling of the abdomen that is new, acute  Fever of 100F or higher   For urgent or emergent issues, a gastroenterologist can be reached at any hour by calling [phone]. Do not use MyChart messaging for urgent concerns.    DIET:  We do recommend a small meal at first, but then you may proceed to your regular diet.  Drink plenty of fluids but you should avoid alcoholic beverages for 24  hours.  ACTIVITY:  You should plan to take it easy for the rest of today and you should NOT DRIVE or use heavy machinery until tomorrow (because of the sedation medicines used during the test).    FOLLOW UP: Our staff will call the number listed on your records 48-72 hours following your procedure to check on you and address any questions or concerns that you may have regarding the information given to you following your procedure. If we do not reach you, we will leave a message.  We will attempt to reach you two times.  During this call, we will ask if you have developed any symptoms of COVID 19. If you develop any symptoms (ie: fever, flu-like symptoms, shortness of breath, cough etc.) before then, please call [phone].  If you test positive for Covid 19 in the 2 weeks post procedure, please call and report this information to [place].    If any biopsies were taken you will be contacted by phone or by letter within the next 1-3 weeks.  Please call us at [phone] if you have not heard about the biopsies in 3 weeks.    SIGNATURES/CONFIDENTIALITY: You and/or your care partner have signed paperwork which will be entered into your electronic medical record.  These signatures attest to the fact that that the information above on your After Visit Summary has been reviewed and is understood.  Full responsibility of the confidentiality of this discharge information lies with you and/or  your care-partner. 

## 2021-01-30 NOTE — Progress Notes (Signed)
VS by CW  I have reviewed the patient's medical history in detail and updated the computerized patient record.  

## 2021-01-30 NOTE — Progress Notes (Signed)
Called to room to assist during endoscopic procedure.  Patient ID and intended procedure confirmed with present staff. Received instructions for my participation in the procedure from the performing physician.  

## 2021-01-30 NOTE — Op Note (Signed)
[facility] Patient Name: Dylan Grant Procedure Date: 01/30/2021 10:38 AM MRN: [id number] Endoscopist: Mauri Pole , MD Age: 70 Referring MD:  Date of Birth: 1951/06/12 Gender: Male Account #: [id number] Procedure:                Colonoscopy Indications:              High risk colon cancer surveillance: Personal                            history of colonic polyps, High risk colon cancer                            surveillance: Personal history of adenoma less than                            10 mm in size Medicines:                Monitored Anesthesia Care Procedure:                Pre-Anesthesia Assessment:                           - Prior to the procedure, a History and Physical                            was performed, and patient medications and                            allergies were reviewed. The patient's tolerance of                            previous anesthesia was also reviewed. The risks                            and benefits of the procedure and the sedation                            options and risks were discussed with the patient.                            All questions were answered, and informed consent                            was obtained. Prior Anticoagulants: The patient has                            taken no previous anticoagulant or antiplatelet                            agents. ASA Grade Assessment: II - A patient with                            mild systemic disease. After reviewing the risks  and benefits, the patient was deemed in                            satisfactory condition to undergo the procedure.                           After obtaining informed consent, the colonoscope                            was passed under direct vision. Throughout the                            procedure, the patient's blood pressure, pulse, and                            oxygen saturations were monitored continuously.  The                            Olympus PCF-H190DL (#[id number]) Colonoscope was                            introduced through the anus and advanced to the the                            cecum, identified by appendiceal orifice and                            ileocecal valve. The colonoscopy was performed                            without difficulty. The patient tolerated the                            procedure well. The quality of the bowel                            preparation was excellent. The ileocecal valve,                            appendiceal orifice, and rectum were photographed. Scope In: 10:45:14 AM Scope Out: 10:57:58 AM Scope Withdrawal Time: 0 hours 10 minutes 14 seconds  Total Procedure Duration: 0 hours 12 minutes 44 seconds  Findings:                 The perianal and digital rectal examinations were                            normal.                           A 11 mm polyp was found in the transverse colon.                            The polyp was pedunculated. The polyp was removed  with a hot snare. Resection and retrieval were                            complete.                           Non-bleeding external and internal hemorrhoids were                            found during retroflexion. The hemorrhoids were                            medium-sized.                           The exam was otherwise without abnormality. Complications:            No immediate complications. Estimated Blood Loss:     Estimated blood loss was minimal. Impression:               - One 11 mm polyp in the transverse colon, removed                            with a hot snare. Resected and retrieved.                           - Non-bleeding external and internal hemorrhoids.                           - The examination was otherwise normal. Recommendation:           - Patient has a contact number available for                            emergencies. The signs and  symptoms of potential                            delayed complications were discussed with the                            patient. Return to normal activities tomorrow.                            Written discharge instructions were provided to the                            patient.                           - Resume previous diet.                           - Continue present medications.                           - Await pathology results.                           -  Repeat colonoscopy in 3 - 5 years for                            surveillance based on pathology results. Mauri Pole, MD 01/30/2021 11:02:32 AM This report has been signed electronically.

## 2021-01-30 NOTE — Progress Notes (Signed)
Report to PACU, RN, vss, BBS= Clear.  

## 2021-02-01 ENCOUNTER — Telehealth: Payer: Self-pay | Admitting: *Deleted

## 2021-02-01 NOTE — Telephone Encounter (Signed)
  Follow up Call-  Call back number 01/30/2021  Post procedure Call Back phone  # [phone]  Permission to leave phone message Yes  Some recent data might be hidden     Patient questions:  Do you have a fever, pain , or abdominal swelling? No. Pain Score  0 *  Have you tolerated food without any problems? Yes.    Have you been able to return to your normal activities? Yes.    Do you have any questions about your discharge instructions: Diet   No. Medications  No. Follow up visit  No.  Do you have questions or concerns about your Care? No.  Actions: * If pain score is 4 or above: No action needed, pain <4.  1. Have you developed a fever since your procedure? no  2.   Have you had an respiratory symptoms (SOB or cough) since your procedure? no  3.   Have you tested positive for COVID 19 since your procedure no  4.   Have you had any family members/close contacts diagnosed with the COVID 19 since your procedure?  no   If yes to any of these questions please route to Joylene John, RN and Joella Prince, RN

## 2021-02-10 ENCOUNTER — Encounter: Payer: Self-pay | Admitting: Gastroenterology

## 2021-02-17 DIAGNOSIS — E878 Other disorders of electrolyte and fluid balance, not elsewhere classified: Secondary | ICD-10-CM | POA: Diagnosis not present

## 2021-02-17 DIAGNOSIS — Z85528 Personal history of other malignant neoplasm of kidney: Secondary | ICD-10-CM | POA: Diagnosis not present

## 2021-02-17 DIAGNOSIS — R519 Headache, unspecified: Secondary | ICD-10-CM | POA: Diagnosis not present

## 2021-02-22 ENCOUNTER — Other Ambulatory Visit: Payer: Self-pay | Admitting: Internal Medicine

## 2021-02-22 DIAGNOSIS — Z85528 Personal history of other malignant neoplasm of kidney: Secondary | ICD-10-CM

## 2021-02-24 DIAGNOSIS — M25511 Pain in right shoulder: Secondary | ICD-10-CM | POA: Diagnosis not present

## 2021-02-24 DIAGNOSIS — M25512 Pain in left shoulder: Secondary | ICD-10-CM | POA: Diagnosis not present

## 2021-02-24 DIAGNOSIS — M545 Low back pain, unspecified: Secondary | ICD-10-CM | POA: Diagnosis not present

## 2021-02-24 DIAGNOSIS — M542 Cervicalgia: Secondary | ICD-10-CM | POA: Diagnosis not present

## 2021-02-27 DIAGNOSIS — M545 Low back pain, unspecified: Secondary | ICD-10-CM | POA: Diagnosis not present

## 2021-02-27 DIAGNOSIS — M542 Cervicalgia: Secondary | ICD-10-CM | POA: Diagnosis not present

## 2021-02-27 DIAGNOSIS — M25512 Pain in left shoulder: Secondary | ICD-10-CM | POA: Diagnosis not present

## 2021-02-27 DIAGNOSIS — M25511 Pain in right shoulder: Secondary | ICD-10-CM | POA: Diagnosis not present

## 2021-03-02 DIAGNOSIS — K219 Gastro-esophageal reflux disease without esophagitis: Secondary | ICD-10-CM | POA: Diagnosis not present

## 2021-03-02 DIAGNOSIS — E119 Type 2 diabetes mellitus without complications: Secondary | ICD-10-CM | POA: Diagnosis not present

## 2021-03-02 DIAGNOSIS — D4120 Neoplasm of uncertain behavior of unspecified ureter: Secondary | ICD-10-CM | POA: Diagnosis not present

## 2021-03-02 DIAGNOSIS — E7800 Pure hypercholesterolemia, unspecified: Secondary | ICD-10-CM | POA: Diagnosis not present

## 2021-03-02 DIAGNOSIS — N183 Chronic kidney disease, stage 3 unspecified: Secondary | ICD-10-CM | POA: Diagnosis not present

## 2021-03-02 DIAGNOSIS — E785 Hyperlipidemia, unspecified: Secondary | ICD-10-CM | POA: Diagnosis not present

## 2021-03-02 DIAGNOSIS — N401 Enlarged prostate with lower urinary tract symptoms: Secondary | ICD-10-CM | POA: Diagnosis not present

## 2021-03-02 DIAGNOSIS — D4100 Neoplasm of uncertain behavior of unspecified kidney: Secondary | ICD-10-CM | POA: Diagnosis not present

## 2021-03-02 DIAGNOSIS — E781 Pure hyperglyceridemia: Secondary | ICD-10-CM | POA: Diagnosis not present

## 2021-03-02 DIAGNOSIS — I10 Essential (primary) hypertension: Secondary | ICD-10-CM | POA: Diagnosis not present

## 2021-03-04 ENCOUNTER — Ambulatory Visit
Admission: RE | Admit: 2021-03-04 | Discharge: 2021-03-04 | Disposition: A | Discharge status: Home / Self Care | Payer: Medicare HMO | Source: Ambulatory Visit | Attending: Internal Medicine | Admitting: Internal Medicine

## 2021-03-04 ENCOUNTER — Other Ambulatory Visit: Payer: Self-pay

## 2021-03-04 DIAGNOSIS — Z85528 Personal history of other malignant neoplasm of kidney: Secondary | ICD-10-CM

## 2021-03-04 DIAGNOSIS — R42 Dizziness and giddiness: Secondary | ICD-10-CM | POA: Diagnosis not present

## 2021-03-04 DIAGNOSIS — I6782 Cerebral ischemia: Secondary | ICD-10-CM | POA: Diagnosis not present

## 2021-03-04 MED ORDER — GADOBENATE DIMEGLUMINE 529 MG/ML IV SOLN
17.0000 mL | Freq: Once | INTRAVENOUS | Status: AC | PRN
  Administered 2021-03-04: 17 mL via INTRAVENOUS

## 2021-03-06 DIAGNOSIS — M25512 Pain in left shoulder: Secondary | ICD-10-CM | POA: Diagnosis not present

## 2021-03-06 DIAGNOSIS — M542 Cervicalgia: Secondary | ICD-10-CM | POA: Diagnosis not present

## 2021-03-06 DIAGNOSIS — M545 Low back pain, unspecified: Secondary | ICD-10-CM | POA: Diagnosis not present

## 2021-03-06 DIAGNOSIS — M25511 Pain in right shoulder: Secondary | ICD-10-CM | POA: Diagnosis not present

## 2021-03-10 DIAGNOSIS — N189 Chronic kidney disease, unspecified: Secondary | ICD-10-CM | POA: Diagnosis not present

## 2021-03-10 DIAGNOSIS — E785 Hyperlipidemia, unspecified: Secondary | ICD-10-CM | POA: Diagnosis not present

## 2021-03-10 DIAGNOSIS — N1831 Chronic kidney disease, stage 3a: Secondary | ICD-10-CM | POA: Diagnosis not present

## 2021-03-10 DIAGNOSIS — E1165 Type 2 diabetes mellitus with hyperglycemia: Secondary | ICD-10-CM | POA: Diagnosis not present

## 2021-04-11 DIAGNOSIS — E782 Mixed hyperlipidemia: Secondary | ICD-10-CM | POA: Diagnosis not present

## 2021-04-11 DIAGNOSIS — I10 Essential (primary) hypertension: Secondary | ICD-10-CM | POA: Diagnosis not present

## 2021-04-11 DIAGNOSIS — D4100 Neoplasm of uncertain behavior of unspecified kidney: Secondary | ICD-10-CM | POA: Diagnosis not present

## 2021-04-11 DIAGNOSIS — N183 Chronic kidney disease, stage 3 unspecified: Secondary | ICD-10-CM | POA: Diagnosis not present

## 2021-04-11 DIAGNOSIS — E781 Pure hyperglyceridemia: Secondary | ICD-10-CM | POA: Diagnosis not present

## 2021-04-11 DIAGNOSIS — K219 Gastro-esophageal reflux disease without esophagitis: Secondary | ICD-10-CM | POA: Diagnosis not present

## 2021-04-11 DIAGNOSIS — N401 Enlarged prostate with lower urinary tract symptoms: Secondary | ICD-10-CM | POA: Diagnosis not present

## 2021-04-11 DIAGNOSIS — D4120 Neoplasm of uncertain behavior of unspecified ureter: Secondary | ICD-10-CM | POA: Diagnosis not present

## 2021-04-11 DIAGNOSIS — E119 Type 2 diabetes mellitus without complications: Secondary | ICD-10-CM | POA: Diagnosis not present

## 2021-04-11 DIAGNOSIS — E7800 Pure hypercholesterolemia, unspecified: Secondary | ICD-10-CM | POA: Diagnosis not present

## 2021-04-17 ENCOUNTER — Encounter: Payer: Self-pay | Admitting: Gastroenterology

## 2021-05-22 NOTE — Progress Notes (Signed)
NEUROLOGY CONSULTATION NOTE  Dylan Grant MRN: [id number] DOB: 1951/01/16  Referring provider: Josetta Huddle, MD Primary care provider: Josetta Huddle, MD  Reason for consult:  headaches and dizziness  Assessment/Plan:   New daily persistent headache, resolved.  Likely aggravated by emotional stress. Balance problems - May be simply age-related or related to his lumbar stenosis.  Improved with physical therapy.  A concerning etiology not suspected.  Follow up as needed.  Subjective:  Dylan Grant is a 70 year old male with HTN, DM II, CKD and history of kidney cancer s/p nephrectomy who presents for headache and dizziness.  History supplemented by referring provider's note.  In March, he started experiencing a new headache.  He hasn't had a headache since high school.  He describes a persistent 2-3/10 pressure over his right eye.  No associated nausea, vomiting, photophobia, phonophobia, visual disturbance, autonomic symptoms, numbness and weakness.  It was mostly noticeable when laying in bed at night.  Applying pressure to the area helped.  It lasted until June and spontaneously resolved.  Around the same time, he noticed problems with balance, usually in the shower.  No dizziness.  He has some neuralgia in the feet due to lumbar spinal stenosis.  When these symptoms started, he had taken a break from exercising.  He was also under a lot of stress related to renovating his house.  He started going to the gym and working with a trainer who has helped with is balance and core muscles.  Balance is much better.  MRI of brain with and without contrast on 03/04/2021 personally reviewed showed mild chronic small vessel ischemic changes within the cerebral white matter but otherwise unremarkable.     PAST MEDICAL HISTORY: Past Medical History:  Diagnosis Date   Allergy    Cancer ([place]) 2010   kidney   Cataract    removed bilater   Chronic kidney disease    kidney disease   Diabetes  mellitus without complication ([place])    prediabetes   GERD (gastroesophageal reflux disease)    Hyperlipidemia    out of balance per pt- good choles low    Hypertension     PAST SURGICAL HISTORY: Past Surgical History:  Procedure Laterality Date   ANAL FISSURE REPAIR     CATARACT EXTRACTION Bilateral 2012   COLONOSCOPY     CYSTOSCOPY WITH INSERTION OF UROLIFT     in office    NEPHRECTOMY Left 2010   kidney cancer   POLYPECTOMY      MEDICATIONS: Current Outpatient Medications on File Prior to Visit  Medication Sig Dispense Refill   allopurinol (ZYLOPRIM) 100 MG tablet Take 1 tablet by mouth daily.     amLODipine (NORVASC) 10 MG tablet Take 10 mg by mouth daily.  3   aspirin EC 81 MG tablet Take 81 mg by mouth daily.     Azelastine HCl 137 MCG/SPRAY SOLN USE TWO SPRAYS IN EACH NOSTRIL AS NEEDED     cetirizine (ZYRTEC) 10 MG tablet Take 10 mg by mouth daily.     Cholecalciferol (VITAMIN D) 2000 UNITS tablet Take 2,000 Units by mouth daily.     Coenzyme Q10 50 MG CAPS Take 1 capsule by mouth daily.     docusate sodium (COLACE) 100 MG capsule Take 100 mg by mouth 2 (two) times daily.     famotidine (PEPCID) 20 MG tablet Take 1 tablet by mouth daily.     fluticasone (FLONASE) 50 MCG/ACT nasal spray Place 1  spray into both nostrils daily.      furosemide (LASIX) 20 MG tablet Take 20 mg by mouth daily. (Patient not taking: Reported on 01/16/2021)     metFORMIN (GLUCOPHAGE) 500 MG tablet Take 500 mg by mouth 2 (two) times daily.     Multiple Vitamin (MULTIVITAMIN WITH MINERALS) TABS tablet Take 1 tablet by mouth daily.     olmesartan (BENICAR) 40 MG tablet Take 40 mg by mouth daily.     omega-3 acid ethyl esters (LOVAZA) 1 g capsule Take by mouth 2 (two) times daily.     rosuvastatin (CRESTOR) 20 MG tablet TAKE 1 TABLET BY MOUTH EVERY DAY 90 tablet 2   Current Facility-Administered Medications on File Prior to Visit  Medication Dose Route Frequency Provider Last Rate Last Admin   0.9  %  sodium chloride infusion  500 mL Intravenous Once Nandigam, Venia Minks, MD        ALLERGIES: No Known Allergies  FAMILY HISTORY: Family History  Problem Relation Age of Onset   Hypertension Mother    Dementia Mother    Heart attack Father    Hyperlipidemia Brother    Hypertension Brother    Colon cancer Neg Hx    Colon polyps Neg Hx    Esophageal cancer Neg Hx    Rectal cancer Neg Hx    Stomach cancer Neg Hx     Objective:  Blood pressure 106/69, pulse 64, height '5\' 8"'$  (1.727 m), weight 185 lb (83.9 kg), SpO2 97 %. General: No acute distress.  Patient appears well-groomed.   Head:  Normocephalic/atraumatic Eyes:  fundi examined but not visualized Neck: supple, no paraspinal tenderness, full range of motion Back: No paraspinal tenderness Heart: regular rate and rhythm Lungs: Clear to auscultation bilaterally. Vascular: No carotid bruits. Neurological Exam: Mental status: alert and oriented to person, place, and time, recent and remote memory intact, fund of knowledge intact, attention and concentration intact, speech fluent and not dysarthric, language intact. Cranial nerves: CN I: not tested CN II: pupils equal, round and reactive to light, visual fields intact CN III, IV, VI:  full range of motion, no nystagmus, no ptosis CN V: facial sensation intact. CN VII: upper and lower face symmetric CN VIII: hearing intact CN IX, X: gag intact, uvula midline CN XI: sternocleidomastoid and trapezius muscles intact CN XII: tongue midline Bulk & Tone: normal, no fasciculations. Motor:  muscle strength 5/5 throughout Sensation:  Pinprick, temperature and vibratory sensation intact. Deep Tendon Reflexes:  2+ throughout,  toes downgoing.   Finger to nose testing:  Without dysmetria.   Heel to shin:  Without dysmetria.   Gait:  Mildly broad-based gait with normal stride.  able to turn and tandem walk.  Romberg negative.    Thank you for allowing me to take part in the care of  this patient.  Metta Clines, DO  CC: Josetta Huddle, MD

## 2021-05-23 ENCOUNTER — Other Ambulatory Visit: Payer: Self-pay

## 2021-05-23 ENCOUNTER — Ambulatory Visit: Payer: Medicare HMO | Admitting: Neurology

## 2021-05-23 ENCOUNTER — Encounter: Payer: Self-pay | Admitting: Neurology

## 2021-05-23 VITALS — BP 106/69 | HR 64 | Ht 68.0 in | Wt 185.0 lb

## 2021-05-23 DIAGNOSIS — R519 Headache, unspecified: Secondary | ICD-10-CM

## 2021-05-23 DIAGNOSIS — R2689 Other abnormalities of gait and mobility: Secondary | ICD-10-CM | POA: Diagnosis not present

## 2021-05-23 NOTE — Patient Instructions (Signed)
I don't suspect anything concerning.  Continue working out with Therapist, nutritional and in the gym.  If headaches return and require further management, follow up with me.

## 2021-05-25 ENCOUNTER — Ambulatory Visit: Payer: Medicare HMO | Admitting: Neurology

## 2021-05-26 ENCOUNTER — Other Ambulatory Visit: Payer: Self-pay | Admitting: Cardiology

## 2021-05-30 DIAGNOSIS — I10 Essential (primary) hypertension: Secondary | ICD-10-CM | POA: Diagnosis not present

## 2021-05-30 DIAGNOSIS — E782 Mixed hyperlipidemia: Secondary | ICD-10-CM | POA: Diagnosis not present

## 2021-05-30 DIAGNOSIS — K219 Gastro-esophageal reflux disease without esophagitis: Secondary | ICD-10-CM | POA: Diagnosis not present

## 2021-05-30 DIAGNOSIS — E7800 Pure hypercholesterolemia, unspecified: Secondary | ICD-10-CM | POA: Diagnosis not present

## 2021-05-30 DIAGNOSIS — N183 Chronic kidney disease, stage 3 unspecified: Secondary | ICD-10-CM | POA: Diagnosis not present

## 2021-05-30 DIAGNOSIS — E119 Type 2 diabetes mellitus without complications: Secondary | ICD-10-CM | POA: Diagnosis not present

## 2021-05-30 DIAGNOSIS — E781 Pure hyperglyceridemia: Secondary | ICD-10-CM | POA: Diagnosis not present

## 2021-05-30 DIAGNOSIS — N401 Enlarged prostate with lower urinary tract symptoms: Secondary | ICD-10-CM | POA: Diagnosis not present

## 2021-06-01 DIAGNOSIS — D225 Melanocytic nevi of trunk: Secondary | ICD-10-CM | POA: Diagnosis not present

## 2021-06-01 DIAGNOSIS — L738 Other specified follicular disorders: Secondary | ICD-10-CM | POA: Diagnosis not present

## 2021-06-01 DIAGNOSIS — D2272 Melanocytic nevi of left lower limb, including hip: Secondary | ICD-10-CM | POA: Diagnosis not present

## 2021-06-01 DIAGNOSIS — L821 Other seborrheic keratosis: Secondary | ICD-10-CM | POA: Diagnosis not present

## 2021-06-01 DIAGNOSIS — D2261 Melanocytic nevi of right upper limb, including shoulder: Secondary | ICD-10-CM | POA: Diagnosis not present

## 2021-06-01 DIAGNOSIS — L570 Actinic keratosis: Secondary | ICD-10-CM | POA: Diagnosis not present

## 2021-06-01 DIAGNOSIS — D692 Other nonthrombocytopenic purpura: Secondary | ICD-10-CM | POA: Diagnosis not present

## 2021-06-07 DIAGNOSIS — N189 Chronic kidney disease, unspecified: Secondary | ICD-10-CM | POA: Diagnosis not present

## 2021-06-07 DIAGNOSIS — E1165 Type 2 diabetes mellitus with hyperglycemia: Secondary | ICD-10-CM | POA: Diagnosis not present

## 2021-06-07 DIAGNOSIS — N1831 Chronic kidney disease, stage 3a: Secondary | ICD-10-CM | POA: Diagnosis not present

## 2021-06-26 DIAGNOSIS — H903 Sensorineural hearing loss, bilateral: Secondary | ICD-10-CM | POA: Diagnosis not present

## 2021-07-05 ENCOUNTER — Ambulatory Visit: Payer: Self-pay

## 2021-07-05 ENCOUNTER — Encounter: Payer: Self-pay | Admitting: Physician Assistant

## 2021-07-05 ENCOUNTER — Ambulatory Visit: Payer: Medicare HMO | Admitting: Physician Assistant

## 2021-07-05 DIAGNOSIS — M1712 Unilateral primary osteoarthritis, left knee: Secondary | ICD-10-CM

## 2021-07-05 MED ORDER — METHYLPREDNISOLONE ACETATE 40 MG/ML IJ SUSP
40.0000 mg | INTRAMUSCULAR | Status: AC | PRN
Start: 2021-07-05 — End: 2021-07-05
  Administered 2021-07-05: 40 mg via INTRA_ARTICULAR

## 2021-07-05 MED ORDER — LIDOCAINE HCL 1 % IJ SOLN
3.0000 mL | INTRAMUSCULAR | Status: AC | PRN
  Administered 2021-07-05: 3 mL

## 2021-07-05 NOTE — Progress Notes (Signed)
Office Visit Note   Patient: Dylan Grant           Date of Birth: 13-Jul-1951           MRN: [id number] Visit Date: 07/05/2021              Requested by: Dylan Huddle, MD [address] [place],  [place] [address] PCP: Dylan Huddle, MD   Assessment & Plan: Visit Diagnoses:  1. Primary osteoarthritis of left knee     Plan: Patient tolerated left knee injection well today.  I have him work on Forensic scientist with his physical therapist.  We discussed knee friendly exercises with him at length.  He will follow-up as needed pain persist or becomes worse.  Questions were encouraged and answered.  He was provided with an open patella knee brace today.  Follow-Up Instructions: No follow-ups on file.   Orders:  Orders Placed This Encounter  Procedures   Large Joint Inj   XR Knee 1-2 Views Left   No orders of the defined types were placed in this encounter.     Procedures: Large Joint Inj: L knee on 07/05/2021 2:51 PM Indications: pain Details: 22 G 1.5 in needle, anterolateral approach  Arthrogram: No  Medications: 3 mL lidocaine 1 %; 40 mg methylPREDNISolone acetate 40 MG/ML Outcome: tolerated well, no immediate complications Procedure, treatment alternatives, risks and benefits explained, specific risks discussed. Consent was given by the patient. Immediately prior to procedure a time out was called to verify the correct patient, procedure, equipment, support staff and site/side marked as required. Patient was prepped and draped in the usual sterile fashion.      Clinical Data: No additional findings.   Subjective: Chief Complaint  Patient presents with   Left Knee - Pain    HPI Dylan Grant is a pleasant 69 year old male were seen for the first time for left knee pain that is been ongoing for the past 6 to 8 weeks.  He has had no known injury to his knee.  He states pain is worse whenever he is going down a hill.  Otherwise no significant pain in  the knee.  He has tried an elastic knee support he is asking a knee brace that we have a able to provide him today.  He is diabetic reports good control with his hemoglobin A1c in the 6 range. Review of Systems Negative for fevers, chills, recent vaccines.  Objective: Vital Signs: There were no vitals taken for this visit.  Physical Exam General well-developed well-nourished male in no acute distress. Psych alert and oriented x3 Ortho Exam Bilateral knees good range of motion of both knees.  Significant patellar crepitus bilateral knees.  No instability valgus varus stressing anterior drawer left knee is negative.  McMurray's is negative on the left knee.  No abnormal warmth erythema or effusion of either knee. Specialty Comments:  No specialty comments available.  Imaging: XR Knee 1-2 Views Left  Result Date: 07/05/2021 Left knee AP and lateral views: Mild to moderate patellofemoral changes.  Moderate narrowing medial joint line.  Knee is well located.  No acute fractures.    PMFS History: Patient Active Problem List   Diagnosis Date Noted   Unilateral primary osteoarthritis, left hip 04/26/2020   Hypertension 11/28/2018   Uncontrolled type 2 diabetes mellitus with hyperglycemia ([place]) 11/28/2018   Hypertriglyceridemia 11/28/2018   Atypical chest pain 01/10/2014   Chest pain 01/10/2014   Past Medical History:  Diagnosis Date  Allergy    Cancer ([place]) 2010   kidney   Cataract    removed bilater   Chronic kidney disease    kidney disease   Diabetes mellitus without complication ([facility]CC)    prediabetes   GERD (gastroesophageal reflux disease)    Hyperlipidemia    out of balance per pt- good choles low    Hypertension     Family History  Problem Relation Age of Onset   Dementia Mother    Hypertension Mother    Heart attack Father    Hyperlipidemia Brother    Hypertension Brother    Colon cancer Neg Hx    Colon polyps Neg Hx    Esophageal cancer Neg Hx    Rectal  cancer Neg Hx    Stomach cancer Neg Hx     Past Surgical History:  Procedure Laterality Date   ANAL FISSURE REPAIR     CATARACT EXTRACTION Bilateral 2012   COLONOSCOPY     CYSTOSCOPY WITH INSERTION OF UROLIFT     in office    NEPHRECTOMY Left 2010   kidney cancer   POLYPECTOMY     Social History   Occupational History   Not on file  Tobacco Use   Smoking status: Never   Smokeless tobacco: Never  Vaping Use   Vaping Use: Never used  Substance and Sexual Activity   Alcohol use: No    Alcohol/week: 0.0 standard drinks   Drug use: No   Sexual activity: Not on file

## 2021-07-24 DIAGNOSIS — I129 Hypertensive chronic kidney disease with stage 1 through stage 4 chronic kidney disease, or unspecified chronic kidney disease: Secondary | ICD-10-CM | POA: Diagnosis not present

## 2021-07-24 DIAGNOSIS — E785 Hyperlipidemia, unspecified: Secondary | ICD-10-CM | POA: Diagnosis not present

## 2021-07-24 DIAGNOSIS — D631 Anemia in chronic kidney disease: Secondary | ICD-10-CM | POA: Diagnosis not present

## 2021-07-24 DIAGNOSIS — N2581 Secondary hyperparathyroidism of renal origin: Secondary | ICD-10-CM | POA: Diagnosis not present

## 2021-07-24 DIAGNOSIS — N1831 Chronic kidney disease, stage 3a: Secondary | ICD-10-CM | POA: Diagnosis not present

## 2021-07-24 DIAGNOSIS — E1165 Type 2 diabetes mellitus with hyperglycemia: Secondary | ICD-10-CM | POA: Diagnosis not present

## 2021-07-24 DIAGNOSIS — C649 Malignant neoplasm of unspecified kidney, except renal pelvis: Secondary | ICD-10-CM | POA: Diagnosis not present

## 2021-08-18 IMAGING — MR MR LUMBAR SPINE W/O CM
4 of 5 series · 27 of 48 positions shown · non-contrast
Comparison: Plain films October 26, 2019

CLINICAL DATA: Low back pain.

EXAM:
MRI LUMBAR SPINE WITHOUT CONTRAST
TECHNIQUE: Multiplanar, multisequence MR imaging of the lumbar spine was
performed. No intravenous contrast was administered.

[Series 4: T2 · sagittal · 4.0mm · 1.09mm/px · 6 of 17 slices shown (1 of 2)]
[im 1/17]
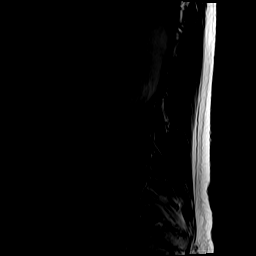
[im 4/17]
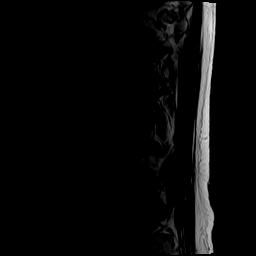
[im 7/17]
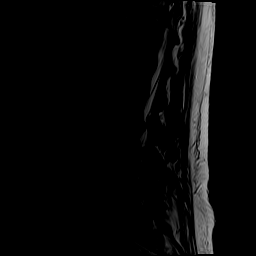
[im 10/17]
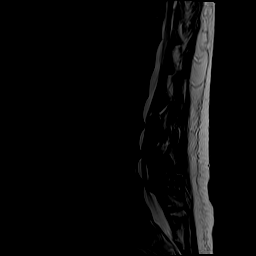
[im 13/17]
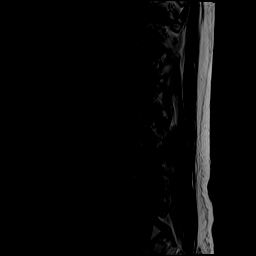
[im 17/17]
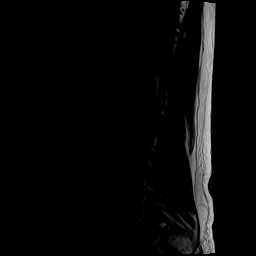

[Series 6: T1 · sagittal · 4.0mm · 1.09mm/px · 6 of 17 slices shown (1 of 2)]
[im 1/17]
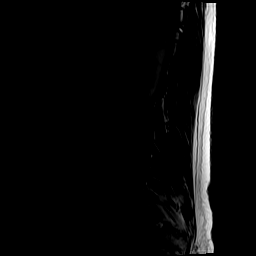
[im 4/17]
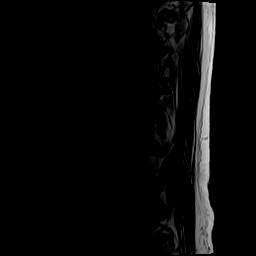
[im 7/17]
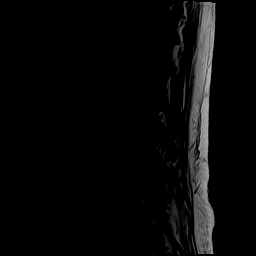
[im 10/17]
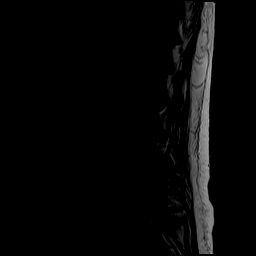
[im 13/17]
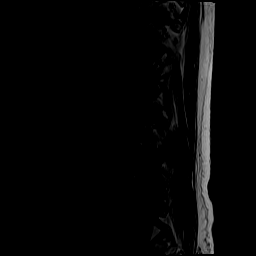
[im 17/17]
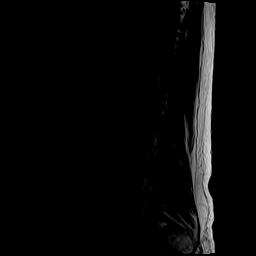

[Series 7: T2 · axial · 4.0mm · 0.39mm/px · z∈[-93,+114]mm · 9 of 42 slices shown (2 of 2)]
[im 1/42]
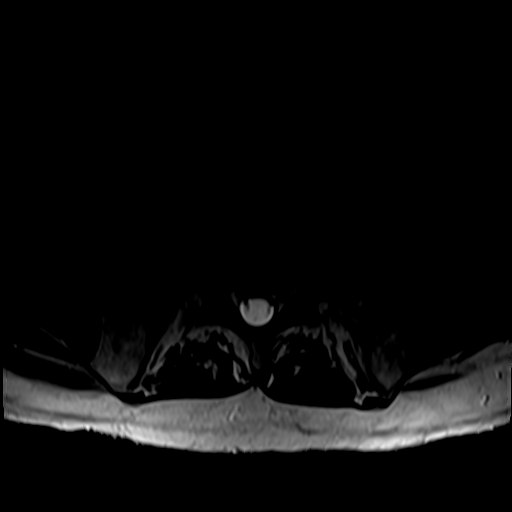
[im 6/42]
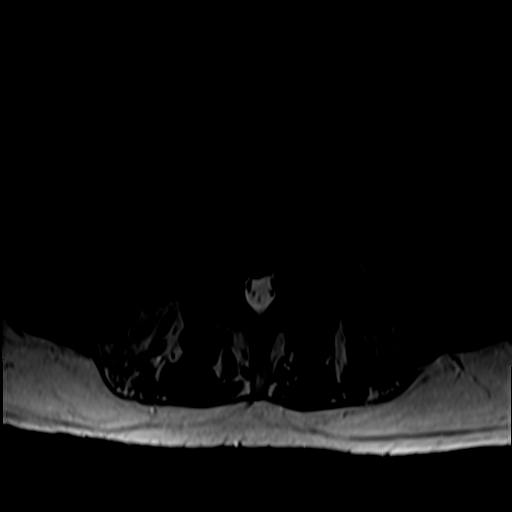
[im 12/42]
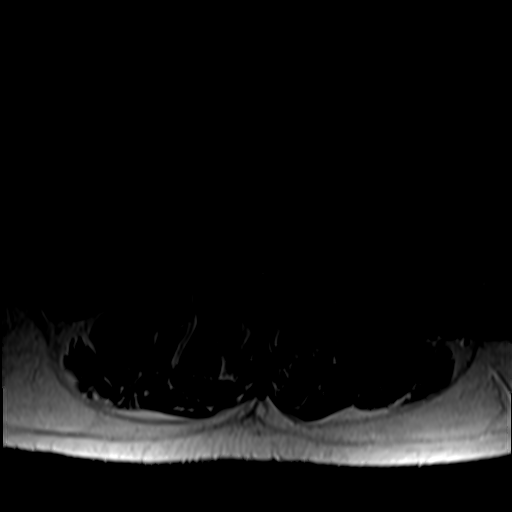
[im 18/42]
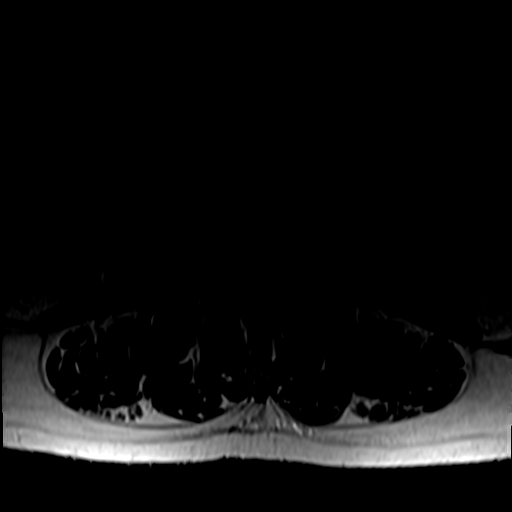
[im 21/42]
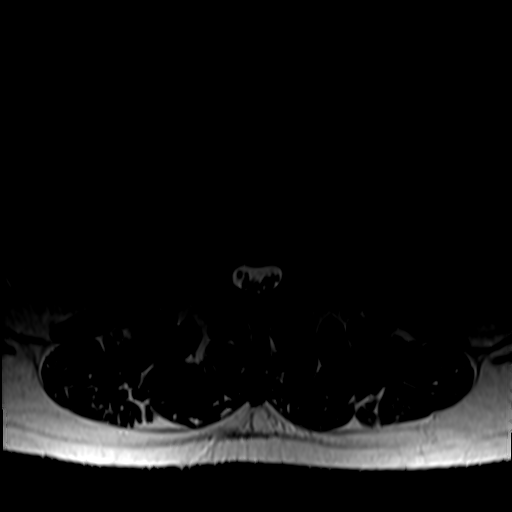
[im 24/42]
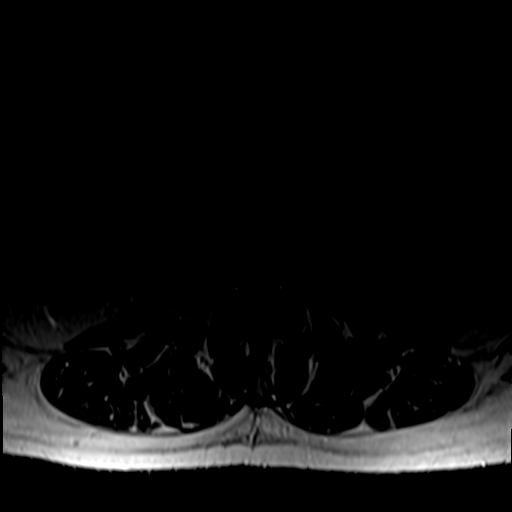
[im 30/42]
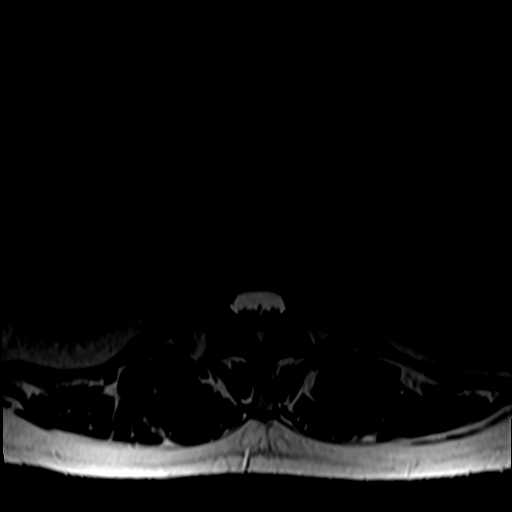
[im 36/42]
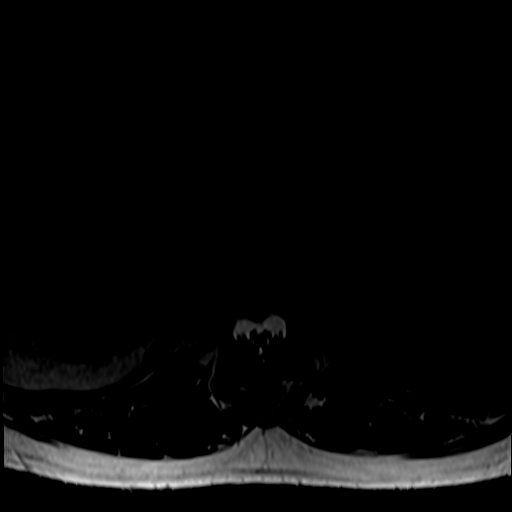
[im 42/42]
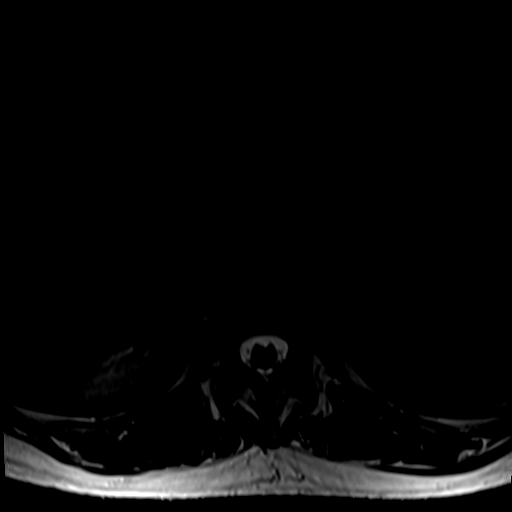

[Series 8: T1 · axial · 4.0mm · 0.39mm/px · z∈[-93,+86]mm · 6 of 42 slices shown (2 of 2)]
[im 1/42]
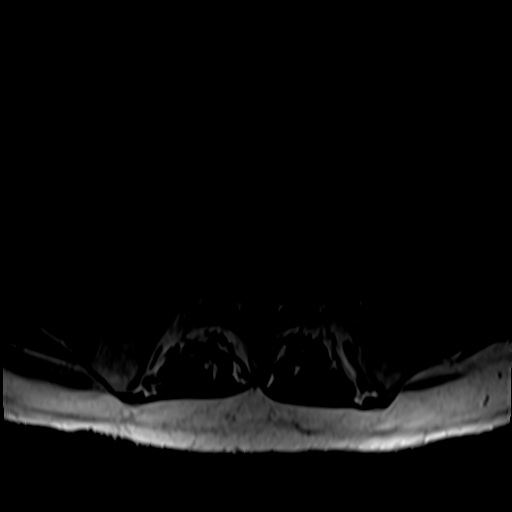
[im 6/42]
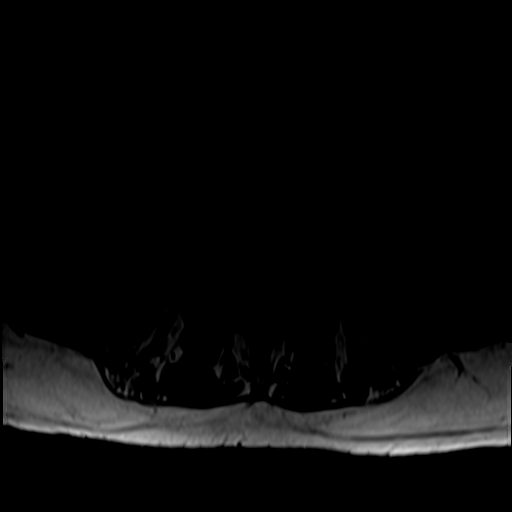
[im 12/42]
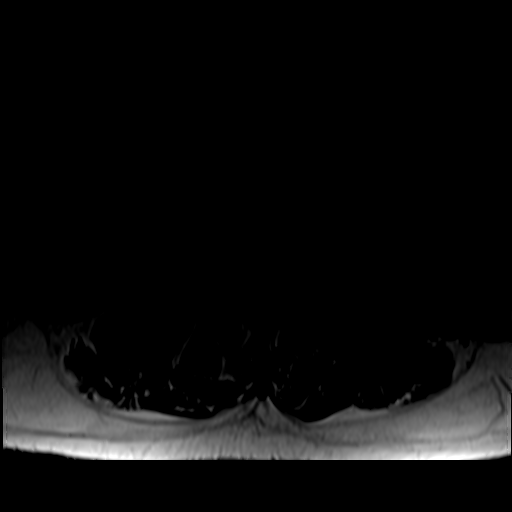
[im 18/42]
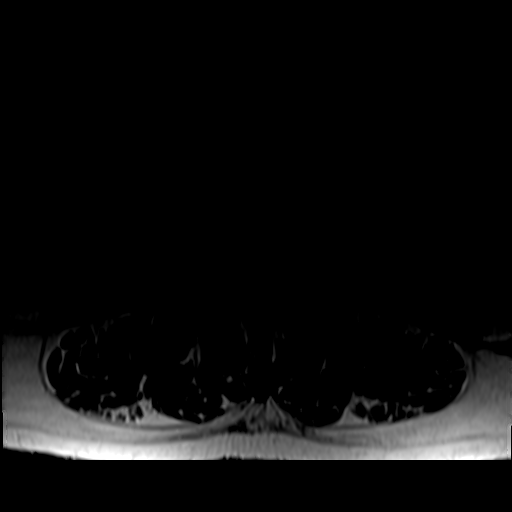
[im 21/42]
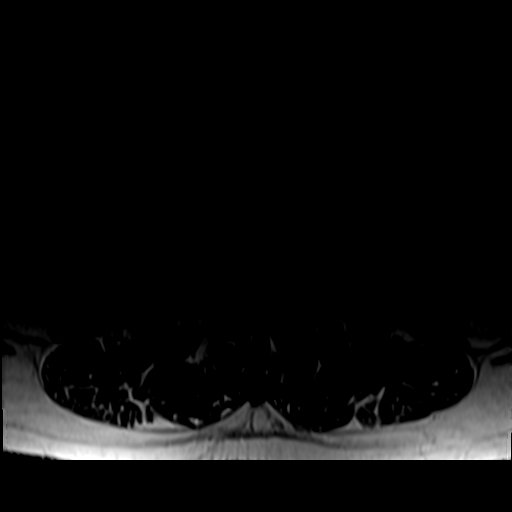
[im 36/42]
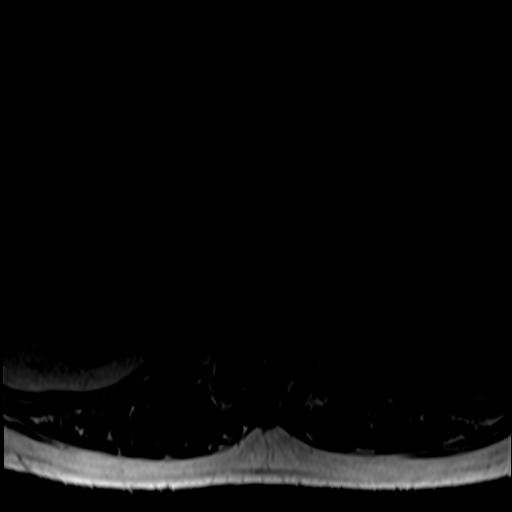

[27 of 48 positions shown; findings below may reference images not displayed]

FINDINGS: Segmentation:  Standard.

Alignment: Dextroconvex scoliosis of the lumbar spine. Minimal
anterolisthesis of L4 over L5.

Vertebrae:  No fracture, evidence of discitis, or bone lesion.

Conus medullaris and cauda equina: Conus extends to the L1 level.
Conus and cauda equina appear normal.

Paraspinal and other soft tissues: Left nephrectomy.

Disc levels:

T12-L1: Posterior disc protrusion causing indentation of the thecal
sac without significant spinal canal or neural foraminal stenosis.

L1-2: Shallow disc bulge and mild facet degenerative changes. No
significant spinal canal neural foraminal stenosis.

L2-3: Disc bulge with superimposed central disc protrusion,
hypertrophic facet degenerative changes and ligamentum flavum
redundancy resulting in moderate spinal canal stenosis and mild
bilateral neural foraminal narrowing.

L3-4: Disc bulge, hypertrophic facet degenerative changes ligamentum
flavum redundancy resulting in severe spinal canal stenosis and
moderate bilateral neural foraminal narrowing.

L4-5: Disc bulge, hypertrophic facet degenerative changes ligamentum
flavum redundancy resulting in severe spinal canal stenosis and
moderate bilateral neural foraminal narrowing.

L5-S1: Loss of disc height, disc bulge with superimposed central
disc protrusion, facet degenerative changes and ligamentum flavum
redundancy resulting in narrowing of the bilateral subarticular
zones and moderate bilateral neural foraminal narrowing.
IMPRESSION: 1. Multilevel degenerative changes of the lumbar spine with severe
spinal canal stenosis at L3-L4 and L4-L5 and moderate spinal canal
stenosis at L2-L3.
2. Moderate bilateral neural foraminal narrowing at L3-L4, L4-L5 and
L5-S1.
3. Left nephrectomy.

## 2021-08-23 DIAGNOSIS — N401 Enlarged prostate with lower urinary tract symptoms: Secondary | ICD-10-CM | POA: Diagnosis not present

## 2021-08-23 DIAGNOSIS — E7800 Pure hypercholesterolemia, unspecified: Secondary | ICD-10-CM | POA: Diagnosis not present

## 2021-08-23 DIAGNOSIS — N183 Chronic kidney disease, stage 3 unspecified: Secondary | ICD-10-CM | POA: Diagnosis not present

## 2021-08-23 DIAGNOSIS — I10 Essential (primary) hypertension: Secondary | ICD-10-CM | POA: Diagnosis not present

## 2021-08-23 DIAGNOSIS — E119 Type 2 diabetes mellitus without complications: Secondary | ICD-10-CM | POA: Diagnosis not present

## 2021-08-23 DIAGNOSIS — K219 Gastro-esophageal reflux disease without esophagitis: Secondary | ICD-10-CM | POA: Diagnosis not present

## 2021-08-23 DIAGNOSIS — N400 Enlarged prostate without lower urinary tract symptoms: Secondary | ICD-10-CM | POA: Diagnosis not present

## 2021-08-23 DIAGNOSIS — E782 Mixed hyperlipidemia: Secondary | ICD-10-CM | POA: Diagnosis not present

## 2021-08-29 DIAGNOSIS — D485 Neoplasm of uncertain behavior of skin: Secondary | ICD-10-CM | POA: Diagnosis not present

## 2021-10-18 DIAGNOSIS — H401134 Primary open-angle glaucoma, bilateral, indeterminate stage: Secondary | ICD-10-CM | POA: Diagnosis not present

## 2021-10-18 DIAGNOSIS — H524 Presbyopia: Secondary | ICD-10-CM | POA: Diagnosis not present

## 2021-10-18 DIAGNOSIS — H40053 Ocular hypertension, bilateral: Secondary | ICD-10-CM | POA: Diagnosis not present

## 2021-10-18 DIAGNOSIS — E119 Type 2 diabetes mellitus without complications: Secondary | ICD-10-CM | POA: Diagnosis not present

## 2021-10-18 DIAGNOSIS — H33311 Horseshoe tear of retina without detachment, right eye: Secondary | ICD-10-CM | POA: Diagnosis not present

## 2021-11-01 DIAGNOSIS — H401114 Primary open-angle glaucoma, right eye, indeterminate stage: Secondary | ICD-10-CM | POA: Diagnosis not present

## 2021-11-15 DIAGNOSIS — H401124 Primary open-angle glaucoma, left eye, indeterminate stage: Secondary | ICD-10-CM | POA: Diagnosis not present

## 2021-11-21 DIAGNOSIS — E559 Vitamin D deficiency, unspecified: Secondary | ICD-10-CM | POA: Diagnosis not present

## 2021-11-21 DIAGNOSIS — R001 Bradycardia, unspecified: Secondary | ICD-10-CM | POA: Diagnosis not present

## 2021-11-21 DIAGNOSIS — Z23 Encounter for immunization: Secondary | ICD-10-CM | POA: Diagnosis not present

## 2021-11-21 DIAGNOSIS — J301 Allergic rhinitis due to pollen: Secondary | ICD-10-CM | POA: Diagnosis not present

## 2021-11-21 DIAGNOSIS — E785 Hyperlipidemia, unspecified: Secondary | ICD-10-CM | POA: Diagnosis not present

## 2021-11-21 DIAGNOSIS — E119 Type 2 diabetes mellitus without complications: Secondary | ICD-10-CM | POA: Diagnosis not present

## 2021-11-21 DIAGNOSIS — Z125 Encounter for screening for malignant neoplasm of prostate: Secondary | ICD-10-CM | POA: Diagnosis not present

## 2021-11-21 DIAGNOSIS — K219 Gastro-esophageal reflux disease without esophagitis: Secondary | ICD-10-CM | POA: Diagnosis not present

## 2021-11-21 DIAGNOSIS — Z1389 Encounter for screening for other disorder: Secondary | ICD-10-CM | POA: Diagnosis not present

## 2021-11-21 DIAGNOSIS — Z1211 Encounter for screening for malignant neoplasm of colon: Secondary | ICD-10-CM | POA: Diagnosis not present

## 2021-11-21 DIAGNOSIS — Z7984 Long term (current) use of oral hypoglycemic drugs: Secondary | ICD-10-CM | POA: Diagnosis not present

## 2021-11-21 DIAGNOSIS — I10 Essential (primary) hypertension: Secondary | ICD-10-CM | POA: Diagnosis not present

## 2021-11-21 DIAGNOSIS — M109 Gout, unspecified: Secondary | ICD-10-CM | POA: Diagnosis not present

## 2021-11-21 DIAGNOSIS — Z0000 Encounter for general adult medical examination without abnormal findings: Secondary | ICD-10-CM | POA: Diagnosis not present

## 2021-11-29 DIAGNOSIS — H401134 Primary open-angle glaucoma, bilateral, indeterminate stage: Secondary | ICD-10-CM | POA: Diagnosis not present

## 2021-12-15 DIAGNOSIS — E7800 Pure hypercholesterolemia, unspecified: Secondary | ICD-10-CM | POA: Diagnosis not present

## 2021-12-15 DIAGNOSIS — E119 Type 2 diabetes mellitus without complications: Secondary | ICD-10-CM | POA: Diagnosis not present

## 2021-12-15 DIAGNOSIS — I10 Essential (primary) hypertension: Secondary | ICD-10-CM | POA: Diagnosis not present

## 2022-01-04 DIAGNOSIS — N1831 Chronic kidney disease, stage 3a: Secondary | ICD-10-CM | POA: Diagnosis not present

## 2022-01-04 DIAGNOSIS — N189 Chronic kidney disease, unspecified: Secondary | ICD-10-CM | POA: Diagnosis not present

## 2022-01-04 DIAGNOSIS — E785 Hyperlipidemia, unspecified: Secondary | ICD-10-CM | POA: Diagnosis not present

## 2022-01-11 DIAGNOSIS — E785 Hyperlipidemia, unspecified: Secondary | ICD-10-CM | POA: Diagnosis not present

## 2022-01-11 DIAGNOSIS — D631 Anemia in chronic kidney disease: Secondary | ICD-10-CM | POA: Diagnosis not present

## 2022-01-11 DIAGNOSIS — C649 Malignant neoplasm of unspecified kidney, except renal pelvis: Secondary | ICD-10-CM | POA: Diagnosis not present

## 2022-01-11 DIAGNOSIS — N2581 Secondary hyperparathyroidism of renal origin: Secondary | ICD-10-CM | POA: Diagnosis not present

## 2022-01-11 DIAGNOSIS — E1165 Type 2 diabetes mellitus with hyperglycemia: Secondary | ICD-10-CM | POA: Diagnosis not present

## 2022-01-11 DIAGNOSIS — N1831 Chronic kidney disease, stage 3a: Secondary | ICD-10-CM | POA: Diagnosis not present

## 2022-01-11 DIAGNOSIS — I129 Hypertensive chronic kidney disease with stage 1 through stage 4 chronic kidney disease, or unspecified chronic kidney disease: Secondary | ICD-10-CM | POA: Diagnosis not present

## 2022-02-16 DIAGNOSIS — N401 Enlarged prostate with lower urinary tract symptoms: Secondary | ICD-10-CM | POA: Diagnosis not present

## 2022-02-16 DIAGNOSIS — R351 Nocturia: Secondary | ICD-10-CM | POA: Diagnosis not present

## 2022-02-21 DIAGNOSIS — N401 Enlarged prostate with lower urinary tract symptoms: Secondary | ICD-10-CM | POA: Diagnosis not present

## 2022-02-21 DIAGNOSIS — E785 Hyperlipidemia, unspecified: Secondary | ICD-10-CM | POA: Diagnosis not present

## 2022-02-21 DIAGNOSIS — E119 Type 2 diabetes mellitus without complications: Secondary | ICD-10-CM | POA: Diagnosis not present

## 2022-02-21 DIAGNOSIS — I10 Essential (primary) hypertension: Secondary | ICD-10-CM | POA: Diagnosis not present

## 2022-02-21 DIAGNOSIS — K219 Gastro-esophageal reflux disease without esophagitis: Secondary | ICD-10-CM | POA: Diagnosis not present

## 2022-02-21 DIAGNOSIS — N183 Chronic kidney disease, stage 3 unspecified: Secondary | ICD-10-CM | POA: Diagnosis not present

## 2022-03-01 DIAGNOSIS — H401134 Primary open-angle glaucoma, bilateral, indeterminate stage: Secondary | ICD-10-CM | POA: Diagnosis not present

## 2022-04-03 DIAGNOSIS — N1831 Chronic kidney disease, stage 3a: Secondary | ICD-10-CM | POA: Diagnosis not present

## 2022-05-23 DIAGNOSIS — I10 Essential (primary) hypertension: Secondary | ICD-10-CM | POA: Diagnosis not present

## 2022-05-23 DIAGNOSIS — E119 Type 2 diabetes mellitus without complications: Secondary | ICD-10-CM | POA: Diagnosis not present

## 2022-05-23 DIAGNOSIS — N401 Enlarged prostate with lower urinary tract symptoms: Secondary | ICD-10-CM | POA: Diagnosis not present

## 2022-05-23 DIAGNOSIS — E785 Hyperlipidemia, unspecified: Secondary | ICD-10-CM | POA: Diagnosis not present

## 2022-05-23 DIAGNOSIS — K219 Gastro-esophageal reflux disease without esophagitis: Secondary | ICD-10-CM | POA: Diagnosis not present

## 2022-05-23 DIAGNOSIS — N183 Chronic kidney disease, stage 3 unspecified: Secondary | ICD-10-CM | POA: Diagnosis not present

## 2022-07-05 DIAGNOSIS — E1165 Type 2 diabetes mellitus with hyperglycemia: Secondary | ICD-10-CM | POA: Diagnosis not present

## 2022-07-05 DIAGNOSIS — N1831 Chronic kidney disease, stage 3a: Secondary | ICD-10-CM | POA: Diagnosis not present

## 2022-07-19 DIAGNOSIS — E1165 Type 2 diabetes mellitus with hyperglycemia: Secondary | ICD-10-CM | POA: Diagnosis not present

## 2022-07-19 DIAGNOSIS — C649 Malignant neoplasm of unspecified kidney, except renal pelvis: Secondary | ICD-10-CM | POA: Diagnosis not present

## 2022-07-19 DIAGNOSIS — D631 Anemia in chronic kidney disease: Secondary | ICD-10-CM | POA: Diagnosis not present

## 2022-07-19 DIAGNOSIS — E785 Hyperlipidemia, unspecified: Secondary | ICD-10-CM | POA: Diagnosis not present

## 2022-07-19 DIAGNOSIS — I129 Hypertensive chronic kidney disease with stage 1 through stage 4 chronic kidney disease, or unspecified chronic kidney disease: Secondary | ICD-10-CM | POA: Diagnosis not present

## 2022-07-19 DIAGNOSIS — N1831 Chronic kidney disease, stage 3a: Secondary | ICD-10-CM | POA: Diagnosis not present

## 2022-08-15 DIAGNOSIS — E119 Type 2 diabetes mellitus without complications: Secondary | ICD-10-CM | POA: Diagnosis not present

## 2022-08-15 DIAGNOSIS — K219 Gastro-esophageal reflux disease without esophagitis: Secondary | ICD-10-CM | POA: Diagnosis not present

## 2022-08-15 DIAGNOSIS — N401 Enlarged prostate with lower urinary tract symptoms: Secondary | ICD-10-CM | POA: Diagnosis not present

## 2022-08-15 DIAGNOSIS — I10 Essential (primary) hypertension: Secondary | ICD-10-CM | POA: Diagnosis not present

## 2022-08-15 DIAGNOSIS — N183 Chronic kidney disease, stage 3 unspecified: Secondary | ICD-10-CM | POA: Diagnosis not present

## 2022-08-15 DIAGNOSIS — E785 Hyperlipidemia, unspecified: Secondary | ICD-10-CM | POA: Diagnosis not present

## 2022-09-07 DIAGNOSIS — H524 Presbyopia: Secondary | ICD-10-CM | POA: Diagnosis not present

## 2022-09-07 DIAGNOSIS — D3132 Benign neoplasm of left choroid: Secondary | ICD-10-CM | POA: Diagnosis not present

## 2022-09-07 DIAGNOSIS — H43813 Vitreous degeneration, bilateral: Secondary | ICD-10-CM | POA: Diagnosis not present

## 2022-09-07 DIAGNOSIS — E119 Type 2 diabetes mellitus without complications: Secondary | ICD-10-CM | POA: Diagnosis not present

## 2022-09-07 DIAGNOSIS — H401134 Primary open-angle glaucoma, bilateral, indeterminate stage: Secondary | ICD-10-CM | POA: Diagnosis not present

## 2022-09-11 DIAGNOSIS — Z0100 Encounter for examination of eyes and vision without abnormal findings: Secondary | ICD-10-CM | POA: Diagnosis not present

## 2022-09-28 DIAGNOSIS — J0140 Acute pansinusitis, unspecified: Secondary | ICD-10-CM | POA: Diagnosis not present

## 2022-09-28 DIAGNOSIS — H6691 Otitis media, unspecified, right ear: Secondary | ICD-10-CM | POA: Diagnosis not present

## 2022-10-04 DIAGNOSIS — R030 Elevated blood-pressure reading, without diagnosis of hypertension: Secondary | ICD-10-CM | POA: Diagnosis not present

## 2022-10-04 DIAGNOSIS — H9193 Unspecified hearing loss, bilateral: Secondary | ICD-10-CM | POA: Diagnosis not present

## 2022-10-04 DIAGNOSIS — R0981 Nasal congestion: Secondary | ICD-10-CM | POA: Diagnosis not present

## 2022-10-04 DIAGNOSIS — H6591 Unspecified nonsuppurative otitis media, right ear: Secondary | ICD-10-CM | POA: Diagnosis not present

## 2022-10-10 DIAGNOSIS — H6991 Unspecified Eustachian tube disorder, right ear: Secondary | ICD-10-CM | POA: Diagnosis not present

## 2022-10-10 DIAGNOSIS — H9313 Tinnitus, bilateral: Secondary | ICD-10-CM | POA: Diagnosis not present

## 2022-10-10 DIAGNOSIS — Z570 Occupational exposure to noise: Secondary | ICD-10-CM | POA: Diagnosis not present

## 2022-10-10 DIAGNOSIS — H90A31 Mixed conductive and sensorineural hearing loss, unilateral, right ear with restricted hearing on the contralateral side: Secondary | ICD-10-CM | POA: Diagnosis not present

## 2022-10-10 DIAGNOSIS — H9191 Unspecified hearing loss, right ear: Secondary | ICD-10-CM | POA: Diagnosis not present

## 2022-10-10 DIAGNOSIS — H6591 Unspecified nonsuppurative otitis media, right ear: Secondary | ICD-10-CM | POA: Diagnosis not present

## 2022-11-12 DIAGNOSIS — H6591 Unspecified nonsuppurative otitis media, right ear: Secondary | ICD-10-CM | POA: Diagnosis not present

## 2022-11-29 DIAGNOSIS — Z1159 Encounter for screening for other viral diseases: Secondary | ICD-10-CM | POA: Diagnosis not present

## 2022-11-29 DIAGNOSIS — Z0000 Encounter for general adult medical examination without abnormal findings: Secondary | ICD-10-CM | POA: Diagnosis not present

## 2022-11-29 DIAGNOSIS — M25551 Pain in right hip: Secondary | ICD-10-CM | POA: Diagnosis not present

## 2022-11-29 DIAGNOSIS — M109 Gout, unspecified: Secondary | ICD-10-CM | POA: Diagnosis not present

## 2022-11-29 DIAGNOSIS — I10 Essential (primary) hypertension: Secondary | ICD-10-CM | POA: Diagnosis not present

## 2022-11-29 DIAGNOSIS — N400 Enlarged prostate without lower urinary tract symptoms: Secondary | ICD-10-CM | POA: Diagnosis not present

## 2022-11-29 DIAGNOSIS — E1122 Type 2 diabetes mellitus with diabetic chronic kidney disease: Secondary | ICD-10-CM | POA: Diagnosis not present

## 2022-11-29 DIAGNOSIS — E785 Hyperlipidemia, unspecified: Secondary | ICD-10-CM | POA: Diagnosis not present

## 2022-11-29 DIAGNOSIS — E559 Vitamin D deficiency, unspecified: Secondary | ICD-10-CM | POA: Diagnosis not present

## 2022-11-29 DIAGNOSIS — M25552 Pain in left hip: Secondary | ICD-10-CM | POA: Diagnosis not present

## 2022-11-29 DIAGNOSIS — Z85528 Personal history of other malignant neoplasm of kidney: Secondary | ICD-10-CM | POA: Diagnosis not present

## 2022-11-29 DIAGNOSIS — N1831 Chronic kidney disease, stage 3a: Secondary | ICD-10-CM | POA: Diagnosis not present

## 2022-11-29 DIAGNOSIS — M545 Low back pain, unspecified: Secondary | ICD-10-CM | POA: Diagnosis not present

## 2022-12-11 DIAGNOSIS — H6591 Unspecified nonsuppurative otitis media, right ear: Secondary | ICD-10-CM | POA: Diagnosis not present

## 2023-01-09 DIAGNOSIS — N189 Chronic kidney disease, unspecified: Secondary | ICD-10-CM | POA: Diagnosis not present

## 2023-01-09 DIAGNOSIS — E1165 Type 2 diabetes mellitus with hyperglycemia: Secondary | ICD-10-CM | POA: Diagnosis not present

## 2023-01-09 DIAGNOSIS — E785 Hyperlipidemia, unspecified: Secondary | ICD-10-CM | POA: Diagnosis not present

## 2023-01-09 DIAGNOSIS — N1831 Chronic kidney disease, stage 3a: Secondary | ICD-10-CM | POA: Diagnosis not present

## 2023-01-18 DIAGNOSIS — I129 Hypertensive chronic kidney disease with stage 1 through stage 4 chronic kidney disease, or unspecified chronic kidney disease: Secondary | ICD-10-CM | POA: Diagnosis not present

## 2023-01-18 DIAGNOSIS — D631 Anemia in chronic kidney disease: Secondary | ICD-10-CM | POA: Diagnosis not present

## 2023-01-18 DIAGNOSIS — N1831 Chronic kidney disease, stage 3a: Secondary | ICD-10-CM | POA: Diagnosis not present

## 2023-01-18 DIAGNOSIS — C649 Malignant neoplasm of unspecified kidney, except renal pelvis: Secondary | ICD-10-CM | POA: Diagnosis not present

## 2023-01-18 DIAGNOSIS — E1165 Type 2 diabetes mellitus with hyperglycemia: Secondary | ICD-10-CM | POA: Diagnosis not present

## 2023-02-08 DIAGNOSIS — N401 Enlarged prostate with lower urinary tract symptoms: Secondary | ICD-10-CM | POA: Diagnosis not present

## 2023-02-08 DIAGNOSIS — R351 Nocturia: Secondary | ICD-10-CM | POA: Diagnosis not present

## 2023-02-08 DIAGNOSIS — R350 Frequency of micturition: Secondary | ICD-10-CM | POA: Diagnosis not present

## 2023-02-08 DIAGNOSIS — Z85528 Personal history of other malignant neoplasm of kidney: Secondary | ICD-10-CM | POA: Diagnosis not present

## 2023-02-08 DIAGNOSIS — R3915 Urgency of urination: Secondary | ICD-10-CM | POA: Diagnosis not present

## 2023-02-20 DIAGNOSIS — N400 Enlarged prostate without lower urinary tract symptoms: Secondary | ICD-10-CM | POA: Diagnosis not present

## 2023-02-20 DIAGNOSIS — E1122 Type 2 diabetes mellitus with diabetic chronic kidney disease: Secondary | ICD-10-CM | POA: Diagnosis not present

## 2023-02-20 DIAGNOSIS — Z125 Encounter for screening for malignant neoplasm of prostate: Secondary | ICD-10-CM | POA: Diagnosis not present

## 2023-02-20 DIAGNOSIS — I517 Cardiomegaly: Secondary | ICD-10-CM | POA: Diagnosis not present

## 2023-02-20 DIAGNOSIS — E786 Lipoprotein deficiency: Secondary | ICD-10-CM | POA: Diagnosis not present

## 2023-02-28 DIAGNOSIS — N401 Enlarged prostate with lower urinary tract symptoms: Secondary | ICD-10-CM | POA: Diagnosis not present

## 2023-02-28 DIAGNOSIS — Z85528 Personal history of other malignant neoplasm of kidney: Secondary | ICD-10-CM | POA: Diagnosis not present

## 2023-02-28 DIAGNOSIS — R350 Frequency of micturition: Secondary | ICD-10-CM | POA: Diagnosis not present

## 2023-04-05 DIAGNOSIS — N1831 Chronic kidney disease, stage 3a: Secondary | ICD-10-CM | POA: Diagnosis not present

## 2023-04-13 NOTE — Progress Notes (Unsigned)
Cardiology Office Note:   Date:  04/16/2023  NAME:  Dylan Grant    MRN: [id number] DOB:  1951/09/04   PCP:  Camie Patience, FNP  Cardiologist:  None  Electrophysiologist:  None   Referring MD: Camie Patience, FNP   Chief Complaint  Patient presents with   Hyperlipidemia         History of Present Illness:   Dylan Grant is a 72 y.o. male with a hx of DM, HTN, HLD who is being seen today for the evaluation of HLD at the request of Camie Patience, FNP.  He reports a very strong family history of heart disease.  Father had heart attack in the mid 45s.  Brother has had heart disease.  Sister has a pacemaker.  He is a bit worried about potential heart disease.  Reports no chest pains or trouble breathing.  He is quite active.  Was walking up to 7 miles per day before having back issues.  Now walking up to 1.5 miles per day.  No symptoms of angina.  He has a longstanding history of high triglycerides.  Most recent TGs 234.  LDL low which could be artificially low in the setting of hypertriglyceridemia.  We did discuss rechecking lipids as well as direct LDL including LP(a) testing.  He is also interested in calcium scoring.  He is diabetic but his A1c is 6.5.  CV exam normal.  EKG normal.  Has a baseline first-degree AV block.  This is not worrisome.  I discussed this with him in the office today.  He is retired.  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  He is married.  Has 3 children.  Has 4 grandchildren.  Denies any symptoms of angina today.  Never smoker.  No alcohol or drug use is reported.  Problem List HLD -T chol 75, HDL 27, LDL 12, TG 234 DM -A1c 6.5 HTN CKD 3a RCC s/p resection 2010 1AVB  Past Medical History: Past Medical History:  Diagnosis Date   Allergy    Cancer ([facility]) 2010   kidney   Cataract    removed bilater   Chronic kidney disease    kidney disease   Diabetes mellitus without complication (HCC)    prediabetes   GERD (gastroesophageal reflux  disease)    Hyperlipidemia    out of balance per pt- good choles low    Hypertension     Past Surgical History: Past Surgical History:  Procedure Laterality Date   ANAL FISSURE REPAIR     CATARACT EXTRACTION Bilateral 2012   COLONOSCOPY     CYSTOSCOPY WITH INSERTION OF UROLIFT     in office    NEPHRECTOMY Left 2010   kidney cancer   POLYPECTOMY      Current Medications: Current Meds  Medication Sig   alfuzosin (UROXATRAL) 10 MG 24 hr tablet Take 10 mg by mouth daily.   allopurinol (ZYLOPRIM) 100 MG tablet Take 1 tablet by mouth daily.   amLODipine (NORVASC) 10 MG tablet Take 10 mg by mouth daily.   Azelastine HCl 137 MCG/SPRAY SOLN USE TWO SPRAYS IN EACH NOSTRIL AS NEEDED   cetirizine (ZYRTEC) 10 MG tablet Take 10 mg by mouth daily.   Cholecalciferol (VITAMIN D) 2000 UNITS tablet Take 2,000 Units by mouth daily.   Coenzyme Q10 100 MG TABS Take by mouth daily.   docusate sodium (COLACE) 100 MG capsule Take 100 mg by mouth 2 (two) times daily.  famotidine (PEPCID) 20 MG tablet Take 1 tablet by mouth daily.   fluticasone (FLONASE) 50 MCG/ACT nasal spray Place 1 spray into both nostrils daily.    furosemide (LASIX) 20 MG tablet Take 20 mg by mouth daily.   metFORMIN (GLUCOPHAGE) 500 MG tablet Take 500 mg by mouth 2 (two) times daily.   Multiple Vitamin (MULTIVITAMIN WITH MINERALS) TABS tablet Take 1 tablet by mouth daily.   olmesartan (BENICAR) 40 MG tablet Take 40 mg by mouth daily.   omega-3 acid ethyl esters (LOVAZA) 1 g capsule Take by mouth 2 (two) times daily.   rosuvastatin (CRESTOR) 20 MG tablet TAKE 1 TABLET BY MOUTH EVERY DAY   Current Facility-Administered Medications for the 04/16/23 encounter (Office Visit) with Sande Rives, MD  Medication   0.9 %  sodium chloride infusion     Allergies:    Patient has no known allergies.   Social History: Social History   Socioeconomic History   Marital status: Married    Spouse name: Not on file   Number of  children: 3   Years of education: Not on file   Highest education level: Not on file  Occupational History   Occupation: Retired Teaching laboratory technician work  Tobacco Use   Smoking status: Never   Smokeless tobacco: Never  Vaping Use   Vaping status: Never Used  Substance and Sexual Activity   Alcohol use: No    Alcohol/week: 0.0 standard drinks of alcohol   Drug use: No   Sexual activity: Not on file  Other Topics Concern   Not on file  Social History Narrative   Not on file   Social Determinants of Health   Financial Resource Strain: Not on file  Food Insecurity: Not on file  Transportation Needs: Not on file  Physical Activity: Not on file  Stress: Not on file  Social Connections: Not on file     Family History: The patient's family history includes Dementia in his mother; Heart attack in his father; Hyperlipidemia in his brother; Hypertension in his brother and mother. There is no history of Colon cancer, Colon polyps, Esophageal cancer, Rectal cancer, or Stomach cancer.  ROS:   All other ROS reviewed and negative. Pertinent positives noted in the HPI.     EKGs/Labs/Other Studies Reviewed:   The following studies were personally reviewed by me today:  EKG:  EKG is ordered today.    EKG Interpretation Date/Time:  Tuesday April 16 2023 09:44:15 EDT Ventricular Rate:  56 PR Interval:  224 QRS Duration:  92 QT Interval:  424 QTC Calculation: 409 R Axis:   72  Text Interpretation: Sinus bradycardia with 1st degree A-V block Confirmed by Lennie Odor [phone]) on 04/16/2023 9:46:08 AM   Recent Labs: No results found for requested labs within last 365 days.   Recent Lipid Panel No results found for: "CHOL", "TRIG", "HDL", "CHOLHDL", "VLDL", "LDLCALC", "LDLDIRECT"  Physical Exam:   VS:  BP 116/80   Pulse (!) 56   Ht 5\' 8"  (1.727 m)   Wt 188 lb (85.3 kg)   SpO2 98%   BMI 28.59 kg/m    Wt Readings from Last 3 Encounters:  04/16/23 188 lb (85.3 kg)  05/23/21  185 lb (83.9 kg)  01/30/21 182 lb (82.6 kg)    General: Well nourished, well developed, in no acute distress Head: Atraumatic, normal size  Eyes: PEERLA, EOMI  Neck: Supple, no JVD Endocrine: No thryomegaly Cardiac: Normal S1, S2; RRR; no murmurs, rubs, or gallops  Lungs: Clear to auscultation bilaterally, no wheezing, rhonchi or rales  Abd: Soft, nontender, no hepatomegaly  Ext: No edema, pulses 2+ Musculoskeletal: No deformities, BUE and BLE strength normal and equal Skin: Warm and dry, no rashes   Neuro: Alert and oriented to person, place, time, and situation, CNII-XII grossly intact, no focal deficits  Psych: Normal mood and affect   ASSESSMENT:   Dylan Grant is a 72 y.o. male who presents for the following: 1. Mixed hyperlipidemia   2. Family history of heart disease     PLAN:   1. Mixed hyperlipidemia 2. Family history of heart disease -Strong family history of heart disease.  Proceed with calcium scoring.  No symptoms of angina.  EKG is normal.  I would like to check lipids today with direct LDL and LP(a).  Main issue seems to be triglycerides.  No strong indication to treat this.  Really little benefit of over-the-counter omega-3 fatty acids.  He may qualify for Vascepa if he has an elevated calcium score.  No indications for stress testing.  CV exam is normal.  He will see me back as needed based on his calcium score and lipids.  Disposition: Return if symptoms worsen or fail to improve.  Medication Adjustments/Labs and Tests Ordered: Current medicines are reviewed at length with the patient today.  Concerns regarding medicines are outlined above.  Orders Placed This Encounter  Procedures   CT CARDIAC SCORING (SELF PAY ONLY)   Lipid panel   LDL cholesterol, direct   Lipoprotein A (LPA)   EKG 12-Lead   No orders of the defined types were placed in this encounter.  Patient Instructions  Medication Instructions:  Your physician recommends that you continue on your  current medications as directed. Please refer to the Current Medication list given to you today.  *If you need a refill on your cardiac medications before your next appointment, please call your pharmacy*   Lab Work: Your physician recommends that you have labs drawn today: Lipid panel, Direct LDL & LP(a)  If you have labs (blood work) drawn today and your tests are completely normal, you will receive your results only by: MyChart Message (if you have MyChart) OR A paper copy in the mail If you have any lab test that is abnormal or we need to change your treatment, we will call you to review the results.   Testing/Procedures: Dr. Flora Lipps has ordered a CT coronary calcium score.   Test locations:  [facility]  [place]  [facility]  This is $99 out of pocket.   Coronary CalciumScan A coronary calcium scan is an imaging test used to look for deposits of calcium and other fatty materials (plaques) in the inner lining of the blood vessels of the heart (coronary arteries). These deposits of calcium and plaques can partly clog and narrow the coronary arteries without producing any symptoms or warning signs. This puts a person at risk for a heart attack. This test can detect these deposits before symptoms develop. Tell a health care provider about: Any allergies you have. All medicines you are taking, including vitamins, herbs, eye drops, creams, and over-the-counter medicines. Any problems you or family members have had with anesthetic medicines. Any blood disorders you have. Any surgeries you have had. Any medical conditions you have. Whether you are pregnant or may be pregnant. What are the risks? Generally, this is a safe procedure. However, problems may occur, including: Harm  to a pregnant woman and her unborn baby. This test involves the use of radiation. Radiation exposure can be dangerous to a  pregnant woman and her unborn baby. If you are pregnant, you generally should not have this procedure done. Slight increase in the risk of cancer. This is because of the radiation involved in the test. What happens before the procedure? No preparation is needed for this procedure. What happens during the procedure? You will undress and remove any jewelry around your neck or chest. You will put on a hospital gown. Sticky electrodes will be placed on your chest. The electrodes will be connected to an electrocardiogram (ECG) machine to record a tracing of the electrical activity of your heart. A CT scanner will take pictures of your heart. During this time, you will be asked to lie still and hold your breath for 2-3 seconds while a picture of your heart is being taken. The procedure may vary among health care providers and hospitals. What happens after the procedure? You can get dressed. You can return to your normal activities. It is up to you to get the results of your test. Ask your health care provider, or the department that is doing the test, when your results will be ready. Summary A coronary calcium scan is an imaging test used to look for deposits of calcium and other fatty materials (plaques) in the inner lining of the blood vessels of the heart (coronary arteries). Generally, this is a safe procedure. Tell your health care provider if you are pregnant or may be pregnant. No preparation is needed for this procedure. A CT scanner will take pictures of your heart. You can return to your normal activities after the scan is done. This information is not intended to replace advice given to you by your health care provider. Make sure you discuss any questions you have with your health care provider. Document Released: 03/08/2008 Document Revised: 07/30/2016 Document Reviewed: 07/30/2016 Elsevier Interactive Patient Education  2017 [employer].    Follow-Up: At [facility], you  and your health needs are our priority.  As part of our continuing mission to provide you with exceptional heart care, we have created designated Provider Care Teams.  These Care Teams include your primary Cardiologist (physician) and Advanced Practice Providers (APPs -  Physician Assistants and Nurse Practitioners) who all work together to provide you with the care you need, when you need it.  We recommend signing up for the patient portal called "MyChart".  Sign up information is provided on this After Visit Summary.  MyChart is used to connect with patients for Virtual Visits (Telemedicine).  Patients are able to view lab/test results, encounter notes, upcoming appointments, etc.  Non-urgent messages can be sent to your provider as well.   To learn more about what you can do with MyChart, go to [email or web address].    Your next appointment:   We will see you on an as needed basis  Provider:   Dr. Flora Lipps    Signed, Lenna Gilford. Flora Lipps, MD, [facility]  [facility]  [address], [address] [place], [place] [address] [phone]  04/16/2023 10:07 AM

## 2023-04-16 ENCOUNTER — Ambulatory Visit: Payer: Medicare HMO | Attending: Cardiovascular Disease | Admitting: Cardiovascular Disease

## 2023-04-16 ENCOUNTER — Encounter: Payer: Self-pay | Admitting: Cardiovascular Disease

## 2023-04-16 VITALS — BP 116/80 | HR 56 | Ht 68.0 in | Wt 188.0 lb

## 2023-04-16 DIAGNOSIS — Z8249 Family history of ischemic heart disease and other diseases of the circulatory system: Secondary | ICD-10-CM

## 2023-04-16 DIAGNOSIS — E782 Mixed hyperlipidemia: Secondary | ICD-10-CM | POA: Diagnosis not present

## 2023-04-16 NOTE — Patient Instructions (Signed)
Medication Instructions:  Your physician recommends that you continue on your current medications as directed. Please refer to the Current Medication list given to you today.  *If you need a refill on your cardiac medications before your next appointment, please call your pharmacy*   Lab Work: Your physician recommends that you have labs drawn today: Lipid panel, Direct LDL & LP(a)  If you have labs (blood work) drawn today and your tests are completely normal, you will receive your results only by: MyChart Message (if you have MyChart) OR A paper copy in the mail If you have any lab test that is abnormal or we need to change your treatment, we will call you to review the results.   Testing/Procedures: Dr. Flora Lipps has ordered a CT coronary calcium score.   Test locations:  [facility]  [facility]  This is $99 out of pocket.   Coronary CalciumScan A coronary calcium scan is an imaging test used to look for deposits of calcium and other fatty materials (plaques) in the inner lining of the blood vessels of the heart (coronary arteries). These deposits of calcium and plaques can partly clog and narrow the coronary arteries without producing any symptoms or warning signs. This puts a person at risk for a heart attack. This test can detect these deposits before symptoms develop. Tell a health care provider about: Any allergies you have. All medicines you are taking, including vitamins, herbs, eye drops, creams, and over-the-counter medicines. Any problems you or family members have had with anesthetic medicines. Any blood disorders you have. Any surgeries you have had. Any medical conditions you have. Whether you are pregnant or may be pregnant. What are the risks? Generally, this is a safe procedure. However, problems may occur, including: Harm to a pregnant woman and her unborn baby. This  test involves the use of radiation. Radiation exposure can be dangerous to a pregnant woman and her unborn baby. If you are pregnant, you generally should not have this procedure done. Slight increase in the risk of cancer. This is because of the radiation involved in the test. What happens before the procedure? No preparation is needed for this procedure. What happens during the procedure? You will undress and remove any jewelry around your neck or chest. You will put on a hospital gown. Sticky electrodes will be placed on your chest. The electrodes will be connected to an electrocardiogram (ECG) machine to record a tracing of the electrical activity of your heart. A CT scanner will take pictures of your heart. During this time, you will be asked to lie still and hold your breath for 2-3 seconds while a picture of your heart is being taken. The procedure may vary among health care providers and hospitals. What happens after the procedure? You can get dressed. You can return to your normal activities. It is up to you to get the results of your test. Ask your health care provider, or the department that is doing the test, when your results will be ready. Summary A coronary calcium scan is an imaging test used to look for deposits of calcium and other fatty materials (plaques) in the inner lining of the blood vessels of the heart (coronary arteries). Generally, this is a safe procedure. Tell your health care provider if you are pregnant or may be pregnant. No preparation is needed for this procedure. A CT scanner will take pictures of your heart. You can  return to your normal activities after the scan is done. This information is not intended to replace advice given to you by your health care provider. Make sure you discuss any questions you have with your health care provider. Document Released: 03/08/2008 Document Revised: 07/30/2016 Document Reviewed: 07/30/2016 Elsevier Interactive Patient  Education  2017 [employer].    Follow-Up: At [facility], you and your health needs are our priority.  As part of our continuing mission to provide you with exceptional heart care, we have created designated Provider Care Teams.  These Care Teams include your primary Cardiologist (physician) and Advanced Practice Providers (APPs -  Physician Assistants and Nurse Practitioners) who all work together to provide you with the care you need, when you need it.  We recommend signing up for the patient portal called "MyChart".  Sign up information is provided on this After Visit Summary.  MyChart is used to connect with patients for Virtual Visits (Telemedicine).  Patients are able to view lab/test results, encounter notes, upcoming appointments, etc.  Non-urgent messages can be sent to your provider as well.   To learn more about what you can do with MyChart, go to [email or web address].    Your next appointment:   We will see you on an as needed basis  Provider:   Dr. Flora Lipps

## 2023-04-17 DIAGNOSIS — Z961 Presence of intraocular lens: Secondary | ICD-10-CM | POA: Diagnosis not present

## 2023-04-17 DIAGNOSIS — E119 Type 2 diabetes mellitus without complications: Secondary | ICD-10-CM | POA: Diagnosis not present

## 2023-04-17 DIAGNOSIS — H31001 Unspecified chorioretinal scars, right eye: Secondary | ICD-10-CM | POA: Diagnosis not present

## 2023-04-17 LAB — LIPID PANEL
Chol/HDL Ratio: 2.8 ratio (ref 0.0–5.0)
Cholesterol, Total: 79 mg/dL — ABNORMAL LOW (ref 100–199)
HDL: 28 mg/dL — ABNORMAL LOW (ref >39)
LDL Chol Calc (NIH): 20 mg/dL (ref 0–99)
Triglycerides: 192 mg/dL — ABNORMAL HIGH (ref 0–149)
VLDL Cholesterol Cal: 31 mg/dL (ref 5–40)

## 2023-04-17 LAB — LDL CHOLESTEROL, DIRECT: LDL Direct: 27 mg/dL (ref 0–99)

## 2023-04-17 LAB — LIPOPROTEIN A (LPA): Lipoprotein (a): 14 nmol/L (ref <75.0)

## 2023-04-18 ENCOUNTER — Ambulatory Visit: Payer: Medicare HMO | Admitting: Cardiovascular Disease

## 2023-04-18 ENCOUNTER — Ambulatory Visit (HOSPITAL_BASED_OUTPATIENT_CLINIC_OR_DEPARTMENT_OTHER)
Admission: RE | Admit: 2023-04-18 | Discharge: 2023-04-18 | Disposition: A | Discharge status: Home / Self Care | Payer: Medicare HMO | Source: Ambulatory Visit | Attending: Cardiovascular Disease | Admitting: Cardiovascular Disease

## 2023-04-18 DIAGNOSIS — E782 Mixed hyperlipidemia: Secondary | ICD-10-CM | POA: Insufficient documentation

## 2023-04-18 DIAGNOSIS — Z8249 Family history of ischemic heart disease and other diseases of the circulatory system: Secondary | ICD-10-CM | POA: Insufficient documentation

## 2023-04-30 ENCOUNTER — Telehealth: Payer: Self-pay | Admitting: *Deleted

## 2023-04-30 NOTE — Telephone Encounter (Signed)
-----   Message from Lenna Gilford Arnot sent at 04/28/2023  9:26 PM EDT ----- Elevated coronary calcium score.  Can we set him up for a follow-up visit to discuss a stress test?  Okay to see me in the next 6 to 8 weeks.  I will send a MyChart message.  Gerri Spore T. Flora Lipps, MD, [facility]  [place]  [address], [address] [place], [place] [address] [phone]  9:25 PM

## 2023-04-30 NOTE — Telephone Encounter (Signed)
Reviewed via mychart.   Called left message on voicemail  scheduling appt 05/16/23 at 11:30 am. Request patient to call back to confirm he received message

## 2023-05-14 NOTE — Progress Notes (Unsigned)
Cardiology Office Note:   Date:  05/16/2023  NAME:  Dylan Grant    MRN: [id number] DOB:  1951/04/01   PCP:  Camie Patience, FNP  Cardiologist:  None  Electrophysiologist:  None   Referring MD: Camie Patience, FNP   Chief Complaint  Patient presents with   Follow-up    History of Present Illness:   Dylan Grant is a 72 y.o. male who presents for follow-up of CAD.  Recently underwent coronary calcium scoring for screening.  Value 930 which is 84th percentile.  Lipids are at goal.  Blood pressure at goal.  Reports exertional shortness of breath.  Heavy activity gets him quite winded.  No chest tightness or pressure but his calcium score is quite high.  His EKG is a bit abnormal and he will be unable to do an exercise treadmill test to appropriately assess his degree of ischemia.  He is overweight with a BMI of 28.  We did report that myocardial blood flow assessment with cardiac PET is the better method to exclude any high risk disease.  He is agreeable to this.  He also needs an echo.  CV exam normal.  EKG shows first-degree AV block.  He is still exercising.  He can walk 3 miles per day.  He reports hills and any intensity can get him quite winded.  We discussed evaluation.  He is interested.  Problem List Coronary calcium -CAC 930 (84th percentile) HLD -T chol 79, HDL 28, LDL 20, TG 192, Lp(a) 14 DM -A1c 6.5 HTN CKD 3a RCC s/p resection 2010 1AVB  Past Medical History: Past Medical History:  Diagnosis Date   Allergy    Cancer (HCC) 2010   kidney   Cataract    removed bilater   Chronic kidney disease    kidney disease   Diabetes mellitus without complication (HCC)    prediabetes   GERD (gastroesophageal reflux disease)    Hyperlipidemia    out of balance per pt- good choles low    Hypertension     Past Surgical History: Past Surgical History:  Procedure Laterality Date   ANAL FISSURE REPAIR     CATARACT EXTRACTION Bilateral 2012   COLONOSCOPY      CYSTOSCOPY WITH INSERTION OF UROLIFT     in office    NEPHRECTOMY Left 2010   kidney cancer   POLYPECTOMY      Current Medications: Current Meds  Medication Sig   alfuzosin (UROXATRAL) 10 MG 24 hr tablet Take 10 mg by mouth daily.   allopurinol (ZYLOPRIM) 100 MG tablet Take 1 tablet by mouth daily.   amLODipine (NORVASC) 10 MG tablet Take 10 mg by mouth daily.   aspirin EC 81 MG tablet Take 81 mg by mouth daily.   Azelastine HCl 137 MCG/SPRAY SOLN USE TWO SPRAYS IN EACH NOSTRIL AS NEEDED   cetirizine (ZYRTEC) 10 MG tablet Take 10 mg by mouth daily.   Cholecalciferol (VITAMIN D) 2000 UNITS tablet Take 2,000 Units by mouth daily.   Coenzyme Q10 100 MG TABS Take by mouth daily.   docusate sodium (COLACE) 100 MG capsule Take 100 mg by mouth 2 (two) times daily.   famotidine (PEPCID) 20 MG tablet Take 1 tablet by mouth daily.   fluticasone (FLONASE) 50 MCG/ACT nasal spray Place 1 spray into both nostrils daily.    furosemide (LASIX) 20 MG tablet Take 20 mg by mouth daily.   metFORMIN (GLUCOPHAGE) 500 MG tablet Take 500 mg by mouth 2 (  two) times daily.   Multiple Vitamin (MULTIVITAMIN WITH MINERALS) TABS tablet Take 1 tablet by mouth daily.   olmesartan (BENICAR) 40 MG tablet Take 40 mg by mouth daily.   omega-3 acid ethyl esters (LOVAZA) 1 g capsule Take by mouth 2 (two) times daily.   rosuvastatin (CRESTOR) 20 MG tablet TAKE 1 TABLET BY MOUTH EVERY DAY   Current Facility-Administered Medications for the 05/16/23 encounter (Office Visit) with Sande Rives, MD  Medication   0.9 %  sodium chloride infusion     Allergies:    Patient has no known allergies.   Social History: Social History   Socioeconomic History   Marital status: Married    Spouse name: Not on file   Number of children: 3   Years of education: Not on file   Highest education level: Not on file  Occupational History   Occupation: Retired Teaching laboratory technician work  Tobacco Use   Smoking status: Never    Smokeless tobacco: Never  Vaping Use   Vaping status: Never Used  Substance and Sexual Activity   Alcohol use: No    Alcohol/week: 0.0 standard drinks of alcohol   Drug use: No   Sexual activity: Not on file  Other Topics Concern   Not on file  Social History Narrative   Not on file   Social Determinants of Health   Financial Resource Strain: Not on file  Food Insecurity: Not on file  Transportation Needs: Not on file  Physical Activity: Not on file  Stress: Not on file  Social Connections: Not on file     Family History: The patient's family history includes Dementia in his mother; Heart attack (age of onset: 14) in his father; Hyperlipidemia in his brother; Hypertension in his brother and mother. There is no history of Colon cancer, Colon polyps, Esophageal cancer, Rectal cancer, or Stomach cancer.  ROS:   All other ROS reviewed and negative. Pertinent positives noted in the HPI.     EKGs/Labs/Other Studies Reviewed:   The following studies were personally reviewed by me today:  EKG:  EKG is not ordered today.        Recent Labs: No results found for requested labs within last 365 days.   Recent Lipid Panel    Component Value Date/Time   CHOL 79 (L) 04/16/2023 1014   TRIG 192 (H) 04/16/2023 1014   HDL 28 (L) 04/16/2023 1014   CHOLHDL 2.8 04/16/2023 1014   LDLCALC 20 04/16/2023 1014   LDLDIRECT 27 04/16/2023 1014    Physical Exam:   VS:  BP 110/68 (BP Location: Left Arm, Patient Position: Sitting, Cuff Size: Normal)   Pulse (!) 53   Ht 5\' 8"  (1.727 m)   Wt 186 lb 3.2 oz (84.5 kg)   SpO2 98%   BMI 28.31 kg/m    Wt Readings from Last 3 Encounters:  05/16/23 186 lb 3.2 oz (84.5 kg)  04/16/23 188 lb (85.3 kg)  05/23/21 185 lb (83.9 kg)    General: Well nourished, well developed, in no acute distress Head: Atraumatic, normal size  Eyes: PEERLA, EOMI  Neck: Supple, no JVD Endocrine: No thryomegaly Cardiac: Normal S1, S2; RRR; no murmurs, rubs, or  gallops Lungs: Clear to auscultation bilaterally, no wheezing, rhonchi or rales  Abd: Soft, nontender, no hepatomegaly  Ext: No edema, pulses 2+ Musculoskeletal: No deformities, BUE and BLE strength normal and equal Skin: Warm and dry, no rashes   Neuro: Alert and oriented to person, place, time, and  situation, CNII-XII grossly intact, no focal deficits  Psych: Normal mood and affect   ASSESSMENT:   Dylan Grant is a 72 y.o. male who presents for the following: 1. SOB (shortness of breath) on exertion   2. Coronary artery disease involving native coronary artery of native heart without angina pectoris   3. Agatston coronary artery calcium score greater than 400   4. Mixed hyperlipidemia   5. Renovascular hypertension     PLAN:   1. SOB (shortness of breath) on exertion 2. Coronary artery disease involving native coronary artery of native heart without angina pectoris 3. Agatston coronary artery calcium score greater than 400 4. Mixed hyperlipidemia 5. Renovascular hypertension -He reports shortness of breath with intense exercise.  No chest pain or pressure but does get quite winded.  Calcium score 930 which is 84th percentile.  Not a good candidate for coronary CTA due to kidney disease.  His EKG also shows a pressure AV block and I think that an exercise treadmill stress test would not be accurate.  He is also overweight with a BMI of 28.  I believe cardiac PET would be the better tool to assess for ischemia.  This will also allow for myocardial blood flow assessment to reduce the risk of false positive or false negative studies.  We will also be able to exclude obstructive three-vessel CAD or balanced ischemia which is commonly seen with traditional SPECT stress testing.  He is on aspirin which he should continue.  Blood pressure is well-controlled.  I have also recommended an echo.  He needs a baseline evaluation given his diagnosis of CAD.  He also will continue on Crestor.  His LDL is  at goal.  Triglycerides also are close to goal.  Will continue with proper diet and regular exercise.  He will see [place] yearly.   Informed Consent   Shared Decision Making/Informed Consent The risks [chest pain, shortness of breath, cardiac arrhythmias, dizziness, blood pressure fluctuations, myocardial infarction, stroke/transient ischemic attack, nausea, vomiting, allergic reaction, radiation exposure, metallic taste sensation and life-threatening complications (estimated to be 1 in 10,000)], benefits (risk stratification, diagnosing coronary artery disease, treatment guidance) and alternatives of a cardiac PET stress test were discussed in detail with Dylan Grant and he agrees to proceed.     Disposition: Return in about 1 year (around 05/15/2024).  Medication Adjustments/Labs and Tests Ordered: Current medicines are reviewed at length with the patient today.  Concerns regarding medicines are outlined above.  Orders Placed This Encounter  Procedures   NM PET CT CARDIAC PERFUSION MULTI W/ABSOLUTE BLOODFLOW   Cardiac Stress Test: Informed Consent Details: Physician/Practitioner Attestation; Transcribe to consent form and obtain patient signature   ECHOCARDIOGRAM COMPLETE   No orders of the defined types were placed in this encounter.  Patient Instructions  Medication Instructions:  No changes *If you need a refill on your cardiac medications before your next appointment, please call your pharmacy*  Testing/Procedures: Your physician has requested that you have an echocardiogram. Echocardiography is a painless test that uses sound waves to create images of your heart. It provides your doctor with information about the size and shape of your heart and how well your heart's chambers and valves are working. This procedure takes approximately one hour. There are no restrictions for this procedure. Please do NOT wear cologne, perfume, aftershave, or lotions (deodorant is allowed). Please arrive 15  minutes prior to your appointment time.    How to Prepare for Your Cardiac PET/CT Stress Test:  1. Please do not take these medications before your test:   Medications that may interfere with the cardiac pharmacological stress agent (ex. nitrates - including erectile dysfunction medications, isosorbide mononitrate, tamulosin or beta-blockers) the day of the exam. (Erectile dysfunction medication should be held for at least 72 hrs prior to test) Theophylline containing medications for 12 hours. Dipyridamole 48 hours prior to the test. Your remaining medications may be taken with water.  2. Nothing to eat or drink, except water, 3 hours prior to arrival time.   NO caffeine/decaffeinated products, or chocolate 12 hours prior to arrival.  3. NO perfume, cologne or lotion  4. Total time is 1 to 2 hours; you may want to bring reading material for the waiting time.  5. Please report to Radiology at the [facility] Main Entrance 30 minutes early for your test.  [address] [place], [place] [address]  6. Please report to Radiology at [facility] Main Entrance, medical mall, 30 mins prior to your test.  [address]  [place], [place]  [phone]  Diabetic Preparation:  Hold oral medications. You may take NPH and Lantus insulin. Do not take Humalog or Humulin R (Regular Insulin) the day of your test. Check blood sugars prior to leaving the house. If able to eat breakfast prior to 3 hour fasting, you may take all medications, including your insulin, Do not worry if you miss your breakfast dose of insulin - start at your next meal.  IF YOU THINK YOU MAY BE PREGNANT, OR ARE NURSING PLEASE INFORM THE TECHNOLOGIST.  In preparation for your appointment, medication and supplies will be purchased.  Appointment availability is limited, so if you need to cancel or reschedule, please call the Radiology Department at [phone] [facility]) OR  [phone] [facility])  24 hours in advance to avoid a cancellation fee of $100.00  What to Expect After you Arrive:  Once you arrive and check in for your appointment, you will be taken to a preparation room within the Radiology Department.  A technologist or Nurse will obtain your medical history, verify that you are correctly prepped for the exam, and explain the procedure.  Afterwards,  an IV will be started in your arm and electrodes will be placed on your skin for EKG monitoring during the stress portion of the exam. Then you will be escorted to the PET/CT scanner.  There, staff will get you positioned on the scanner and obtain a blood pressure and EKG.  During the exam, you will continue to be connected to the EKG and blood pressure machines.  A small, safe amount of a radioactive tracer will be injected in your IV to obtain a series of pictures of your heart along with an injection of a stress agent.    After your Exam:  It is recommended that you eat a meal and drink a caffeinated beverage to counter act any effects of the stress agent.  Drink plenty of fluids for the remainder of the day and urinate frequently for the first couple of hours after the exam.  Your doctor will inform you of your test results within 7-10 business days.  For more information and frequently asked questions, please visit our website : [email or web address]  For questions about your test or how to prepare for your test, please call: Cardiac Imaging Nurse Navigators Office: [phone]    Follow-Up: At [facility], you and your health needs are our priority.  As part of  our continuing mission to provide you with exceptional heart care, we have created designated Provider Care Teams.  These Care Teams include your primary Cardiologist (physician) and Advanced Practice Providers (APPs -  Physician Assistants and Nurse Practitioners) who all work together to provide you with the care you need, when  you need it.  We recommend signing up for the patient portal called "MyChart".  Sign up information is provided on this After Visit Summary.  MyChart is used to connect with patients for Virtual Visits (Telemedicine).  Patients are able to view lab/test results, encounter notes, upcoming appointments, etc.  Non-urgent messages can be sent to your provider as well.   To learn more about what you can do with MyChart, go to [email or web address].    Your next appointment:   1 year(s)  Provider:   Dr Flora Lipps     Time Spent with Patient: I have spent a total of 35 minutes with patient reviewing hospital notes, telemetry, EKGs, labs and examining the patient as well as establishing an assessment and plan that was discussed with the patient.  > 50% of time was spent in direct patient care.  Signed, Lenna Gilford. Flora Lipps, MD, [facility]  [facility]  [address], [address] [place], [place] [address] [phone]  05/16/2023 11:38 AM

## 2023-05-16 ENCOUNTER — Ambulatory Visit: Payer: Medicare HMO | Attending: Cardiovascular Disease | Admitting: Cardiovascular Disease

## 2023-05-16 ENCOUNTER — Encounter: Payer: Self-pay | Admitting: Cardiovascular Disease

## 2023-05-16 VITALS — BP 110/68 | HR 53 | Ht 68.0 in | Wt 186.2 lb

## 2023-05-16 DIAGNOSIS — I150 Renovascular hypertension: Secondary | ICD-10-CM

## 2023-05-16 DIAGNOSIS — R0602 Shortness of breath: Secondary | ICD-10-CM

## 2023-05-16 DIAGNOSIS — I2510 Atherosclerotic heart disease of native coronary artery without angina pectoris: Secondary | ICD-10-CM

## 2023-05-16 DIAGNOSIS — E782 Mixed hyperlipidemia: Secondary | ICD-10-CM

## 2023-05-16 DIAGNOSIS — R931 Abnormal findings on diagnostic imaging of heart and coronary circulation: Secondary | ICD-10-CM

## 2023-05-16 NOTE — Patient Instructions (Signed)
Medication Instructions:  No changes *If you need a refill on your cardiac medications before your next appointment, please call your pharmacy*  Testing/Procedures: Your physician has requested that you have an echocardiogram. Echocardiography is a painless test that uses sound waves to create images of your heart. It provides your doctor with information about the size and shape of your heart and how well your heart's chambers and valves are working. This procedure takes approximately one hour. There are no restrictions for this procedure. Please do NOT wear cologne, perfume, aftershave, or lotions (deodorant is allowed). Please arrive 15 minutes prior to your appointment time.    How to Prepare for Your Cardiac PET/CT Stress Test:  1. Please do not take these medications before your test:   Medications that may interfere with the cardiac pharmacological stress agent (ex. nitrates - including erectile dysfunction medications, isosorbide mononitrate, tamulosin or beta-blockers) the day of the exam. (Erectile dysfunction medication should be held for at least 72 hrs prior to test) Theophylline containing medications for 12 hours. Dipyridamole 48 hours prior to the test. Your remaining medications may be taken with water.  2. Nothing to eat or drink, except water, 3 hours prior to arrival time.   NO caffeine/decaffeinated products, or chocolate 12 hours prior to arrival.  3. NO perfume, cologne or lotion  4. Total time is 1 to 2 hours; you may want to bring reading material for the waiting time.  5. Please report to Radiology at the [facility] Main Entrance 30 minutes early for your test.  [address] [place], [place] [address]  6. Please report to Radiology at [facility] Main Entrance, medical mall, 30 mins prior to your test.  [address]  [place], [place]  [phone]  Diabetic Preparation:  Hold oral medications. You may take NPH  and Lantus insulin. Do not take Humalog or Humulin R (Regular Insulin) the day of your test. Check blood sugars prior to leaving the house. If able to eat breakfast prior to 3 hour fasting, you may take all medications, including your insulin, Do not worry if you miss your breakfast dose of insulin - start at your next meal.  IF YOU THINK YOU MAY BE PREGNANT, OR ARE NURSING PLEASE INFORM THE TECHNOLOGIST.  In preparation for your appointment, medication and supplies will be purchased.  Appointment availability is limited, so if you need to cancel or reschedule, please call the Radiology Department at [phone] [address]) OR [phone] [facility])  24 hours in advance to avoid a cancellation fee of $100.00  What to Expect After you Arrive:  Once you arrive and check in for your appointment, you will be taken to a preparation room within the Radiology Department.  A technologist or Nurse will obtain your medical history, verify that you are correctly prepped for the exam, and explain the procedure.  Afterwards,  an IV will be started in your arm and electrodes will be placed on your skin for EKG monitoring during the stress portion of the exam. Then you will be escorted to the PET/CT scanner.  There, staff will get you positioned on the scanner and obtain a blood pressure and EKG.  During the exam, you will continue to be connected to the EKG and blood pressure machines.  A small, safe amount of a radioactive tracer will be injected in your IV to obtain a series of pictures of your heart along with an injection of a stress agent.  After your Exam:  It is recommended that you eat a meal and drink a caffeinated beverage to counter act any effects of the stress agent.  Drink plenty of fluids for the remainder of the day and urinate frequently for the first couple of hours after the exam.  Your doctor will inform you of your test results within 7-10 business days.  For more information and  frequently asked questions, please visit our website : [email or web address]  For questions about your test or how to prepare for your test, please call: Cardiac Imaging Nurse Navigators Office: [phone]    Follow-Up: At [facility], you and your health needs are our priority.  As part of our continuing mission to provide you with exceptional heart care, we have created designated Provider Care Teams.  These Care Teams include your primary Cardiologist (physician) and Advanced Practice Providers (APPs -  Physician Assistants and Nurse Practitioners) who all work together to provide you with the care you need, when you need it.  We recommend signing up for the patient portal called "MyChart".  Sign up information is provided on this After Visit Summary.  MyChart is used to connect with patients for Virtual Visits (Telemedicine).  Patients are able to view lab/test results, encounter notes, upcoming appointments, etc.  Non-urgent messages can be sent to your provider as well.   To learn more about what you can do with MyChart, go to [email or web address].    Your next appointment:   1 year(s)  Provider:   Dr Flora Lipps

## 2023-05-20 ENCOUNTER — Other Ambulatory Visit (INDEPENDENT_AMBULATORY_CARE_PROVIDER_SITE_OTHER): Payer: Medicare HMO

## 2023-05-20 ENCOUNTER — Ambulatory Visit: Payer: Medicare HMO | Admitting: Orthopaedic Surgery

## 2023-05-20 ENCOUNTER — Encounter: Payer: Self-pay | Admitting: Orthopaedic Surgery

## 2023-05-20 ENCOUNTER — Other Ambulatory Visit: Payer: Self-pay

## 2023-05-20 DIAGNOSIS — G8929 Other chronic pain: Secondary | ICD-10-CM

## 2023-05-20 DIAGNOSIS — M545 Low back pain, unspecified: Secondary | ICD-10-CM

## 2023-05-20 NOTE — Progress Notes (Signed)
Office Visit Note   Patient: Dylan Grant           Date of Birth: 1950/11/29           MRN: [id number] Visit Date: 05/20/2023              Requested by: Camie Patience, FNP [address] [place],  [place] [address] PCP: Camie Patience, FNP   Assessment & Plan: Visit Diagnoses:  1. Chronic bilateral low back pain without sciatica     Plan:  Will send him to formal physical therapy for core strengthening, back exercises, hamstring stretching, modalities and a home exercise program.  He will follow-up with [place] in 6 weeks to see what type of response he had therapy.  Questions were encouraged and answered by Dr. Magnus Ivan and myself  Follow-Up Instructions: Return in about 6 weeks (around 07/01/2023).   Orders:  Orders Placed This Encounter  Procedures   XR Lumbar Spine 2-3 Views   No orders of the defined types were placed in this encounter.     Procedures: No procedures performed   Clinical Data: No additional findings.   Subjective: Chief Complaint  Patient presents with   Lower Back - Pain    HPI Dylan Grant 72 year old male who we have seen has comes in today with new complaint of low back discomfort.  He describes it as a tightness when walking and at night.  He states that discomfort goes away when he reaches and touches his toes.  He denies any radicular symptoms down either leg.  He does state that the discomfort in the back can awaken him or the fact that he gets up at night to go to the bathroom awakens him and then he notices that he has tightness in his back.  He denies any unintentional weight loss.  Denies any saddle anesthesia like symptoms.  No radicular symptoms down either leg.  Does note that his back pain has gotten better since he has been going to the gym and walking.  And he feels that walking is helpful for his overall back tightness. Prior MRI December 2021 showed multilevel degenerative changes lumbar spine with severe spinal  stenosis at L 3-L4 and L4- L5, moderate spinal stenosis at L2-3.  Moderate bilateral neural foraminal stenosis at L3-4 L4-5 and L5-S1.  Review of Systems  Constitutional:  Negative for chills and fever.  Musculoskeletal:  Positive for back pain.  Neurological:  Negative for numbness.     Objective: Vital Signs: There were no vitals taken for this visit.  Physical Exam Constitutional:      Appearance: He is not ill-appearing or diaphoretic.  Cardiovascular:     Pulses: Normal pulses.  Neurological:     Mental Status: He is alert and oriented to person, place, and time.  Psychiatric:        Mood and Affect: Mood normal.     Ortho Exam Lower extremities 5 out of 5 strength throughout against resistance.  Normal sensation bilateral feet to light touch.  Exquisitely tight hamstrings bilaterally.  Negative straight leg raise bilaterally.  Limited flexion extension lumbar spine. Specialty Comments:  No specialty comments available.  Imaging: XR Lumbar Spine 2-3 Views  Result Date: 05/20/2023 Lumbar spine 2 views: Loss of lordotic curvature.  Multilevel degenerative changes with disc space loss and anterior vertebral spurring.  No acute fractures or acute findings.    PMFS History: Patient Active Problem List   Diagnosis Date Noted  Unilateral primary osteoarthritis, left hip 04/26/2020   Hypertension 11/28/2018   Uncontrolled type 2 diabetes mellitus with hyperglycemia (HCC) 11/28/2018   Hypertriglyceridemia 11/28/2018   Atypical chest pain 01/10/2014   Chest pain 01/10/2014   Past Medical History:  Diagnosis Date   Allergy    Cancer (HCC) 2010   kidney   Cataract    removed bilater   Chronic kidney disease    kidney disease   Diabetes mellitus without complication (HCC)    prediabetes   GERD (gastroesophageal reflux disease)    Hyperlipidemia    out of balance per pt- good choles low    Hypertension     Family History  Problem Relation Age of Onset   Dementia  Mother    Hypertension Mother    Heart attack Father 36   Hyperlipidemia Brother    Hypertension Brother    Colon cancer Neg Hx    Colon polyps Neg Hx    Esophageal cancer Neg Hx    Rectal cancer Neg Hx    Stomach cancer Neg Hx     Past Surgical History:  Procedure Laterality Date   ANAL FISSURE REPAIR     CATARACT EXTRACTION Bilateral 2012   COLONOSCOPY     CYSTOSCOPY WITH INSERTION OF UROLIFT     in office    NEPHRECTOMY Left 2010   kidney cancer   POLYPECTOMY     Social History   Occupational History   Occupation: Retired Teaching laboratory technician work  Tobacco Use   Smoking status: Never   Smokeless tobacco: Never  Vaping Use   Vaping status: Never Used  Substance and Sexual Activity   Alcohol use: No    Alcohol/week: 0.0 standard drinks of alcohol   Drug use: No   Sexual activity: Not on file

## 2023-05-31 ENCOUNTER — Encounter (HOSPITAL_COMMUNITY): Payer: Self-pay

## 2023-06-03 ENCOUNTER — Other Ambulatory Visit: Payer: Self-pay

## 2023-06-03 ENCOUNTER — Ambulatory Visit: Payer: Medicare HMO | Admitting: Physical Therapy

## 2023-06-03 ENCOUNTER — Encounter: Payer: Self-pay | Admitting: Physical Therapy

## 2023-06-03 DIAGNOSIS — R293 Abnormal posture: Secondary | ICD-10-CM | POA: Diagnosis not present

## 2023-06-03 DIAGNOSIS — R29898 Other symptoms and signs involving the musculoskeletal system: Secondary | ICD-10-CM

## 2023-06-03 DIAGNOSIS — M5459 Other low back pain: Secondary | ICD-10-CM | POA: Diagnosis not present

## 2023-06-03 NOTE — Therapy (Addendum)
OUTPATIENT PHYSICAL THERAPY EVALUATION DISCHARGE SUMMARY   Patient Name: Dylan Grant MRN: [id number] DOB:Oct 28, 1950, 72 y.o., male Today's Date: 07/24/2023  END OF SESSION:    Past Medical History:  Diagnosis Date   Allergy    Cancer (HCC) 2010   kidney   Cataract    removed bilater   Chronic kidney disease    kidney disease   Diabetes mellitus without complication (HCC)    prediabetes   GERD (gastroesophageal reflux disease)    Hyperlipidemia    out of balance per pt- good choles low    Hypertension    Past Surgical History:  Procedure Laterality Date   ANAL FISSURE REPAIR     CATARACT EXTRACTION Bilateral 2012   COLONOSCOPY     CYSTOSCOPY WITH INSERTION OF UROLIFT     in office    NEPHRECTOMY Left 2010   kidney cancer   POLYPECTOMY     Patient Active Problem List   Diagnosis Date Noted   Unilateral primary osteoarthritis, left hip 04/26/2020   Hypertension 11/28/2018   Uncontrolled type 2 diabetes mellitus with hyperglycemia (HCC) 11/28/2018   Hypertriglyceridemia 11/28/2018   Atypical chest pain 01/10/2014   Chest pain 01/10/2014   Renal cancer, left (HCC) 01/22/2009    PCP: Camie Patience, FNP  REFERRING PROVIDER: Kirtland Bouchard, PA-C  REFERRING DIAG: [id number] (ICD-10-CM) - Chronic bilateral low back pain without sciatica  Rationale for Evaluation and Treatment: Rehabilitation  THERAPY DIAG:  Other low back pain - Plan: PT plan of care cert/re-cert  Abnormal posture - Plan: PT plan of care cert/re-cert  Other symptoms and signs involving the musculoskeletal system - Plan: PT plan of care cert/re-cert  ONSET DATE: July 2024   SUBJECTIVE:                                                                                                                                                                                           SUBJECTIVE STATEMENT: Pt reports he has OA in his back, and pain was really bad about 2 months ago which is why  he went to the MD.  He's started walking more over the past few months as well as going to the gym.  He's been going to the gym as well working on back and shoulder strength to try to help with posture.   PERTINENT HISTORY:  Lt hip OA, HTN, DM  PAIN:  Are you having pain? Yes: NPRS scale: none recently/10 Pain location: low back Pain description: occasional spams Aggravating factors: unknown, some spasms with walking Relieving factors: forward flexion  PRECAUTIONS:  None  RED FLAGS: None  WEIGHT BEARING RESTRICTIONS:  No  FALLS:  Has patient fallen in last 6 months? No  LIVING ENVIRONMENT: Lives with: lives with their spouse Lives in: House/apartment Stairs:  has a flight of stairs but goes infrequently and denies difficulty  OCCUPATION:  Retired Scientist, research (physical sciences); HR/admin  PLOF:  Independent and Leisure: walking daily, gym most days for strengthening   PATIENT GOALS:  Learn exercises to do at the gym, things to help with posture   OBJECTIVE:   DIAGNOSTIC FINDINGS: MRI December 2021 showed multilevel degenerative changes lumbar spine with severe spinal stenosis at L 3-L4 and L4- L5, moderate spinal stenosis at L2-3.  Moderate bilateral neural foraminal stenosis at L3-4 L4-5 and L5-S1.  PATIENT SURVEYS:  06/03/23 FOTO 70 (predicted 74)  COGNITIVE STATUS: Within functional limits for tasks assessed   SENSATION: WFL  POSTURE:  No Significant postural limitations  GAIT: 06/03/23 Comments: independent with mild antalgic pattern   PALPATION: 06/03/23: Tightness noted bil hamstrings/piriformis/QL  LUMBAR ROM:   Active  A/PROM  eval  Flexion Limited 25%  Extension Limited 25%  Right quadrant Limited 50%  Left quadrant Limited 50%   (Blank rows = not tested)     LOWER EXTREMITY MMT:    MMT Right eval Left eval  Hip flexion    Hip extension    Hip abduction 4/5 4/5  Hip adduction 4/5 4/5  Hip internal rotation    Hip external rotation    Knee  flexion    Knee extension    Ankle dorsiflexion    Ankle plantarflexion    Ankle inversion    Ankle eversion     (Blank rows = not tested)   TREATMENT:                                                                                                                              DATE:  06/03/23 See HEP - encouraged continued walking as well as current gym program with addition of reverse flys; HEP to focus on core strength and flexibility   PATIENT EDUCATION:  Education details: HEP Person educated: Patient Education method: Explanation, Demonstration, and Handouts Education comprehension: verbalized understanding, returned demonstration, and needs further education  HOME EXERCISE PROGRAM: Access Code: [email or web address] URL: [email or web address] Date: 06/03/2023 Prepared by: Moshe Cipro  Exercises - Supine Posterior Pelvic Tilt  - 2 x daily - 7 x weekly - 1-2 sets - 10 reps - 5 sec hold - Hooklying Isometric Hip Flexion with Opposite Arm  - 1 x daily - 7 x weekly - 1-2 sets - 10 reps - 5 sec hold - Dead Bug  - 1 x daily - 7 x weekly - 1-2 sets - 10 reps - Full Plank on Knees  - 1 x daily - 7 x weekly - 3-5 reps - 20-30 sec hold - Hooklying Single Knee to Chest  - 2 x daily - 7 x weekly - 1 sets - 3 reps - 30 sec  hold - Supine Lower Trunk Rotation  - 2 x daily - 7 x weekly - 1 sets - 3 reps - 30 sec hold - Supine Piriformis Stretch with Foot on Ground  - 2 x daily - 7 x weekly - 1 sets - 3 reps - 30 sec hold - Supine Hamstring Stretch  - 2 x daily - 7 x weekly - 1 sets - 3 reps - 30 sec hold   ASSESSMENT:  CLINICAL IMPRESSION: Patient is a 72 y.o. male who was seen today for physical therapy evaluation and treatment for LBP which has significantly improved over recent weeks as he has resumed exercise. He demonstrates mild strength deficits as well as decreased flexibility affecting functional mobility.  He will benefit from PT to address deficits listed.    OBJECTIVE  IMPAIRMENTS: decreased mobility, decreased strength, hypomobility, increased fascial restrictions, increased muscle spasms, impaired flexibility, postural dysfunction, and pain.   ACTIVITY LIMITATIONS: carrying, lifting, squatting, and locomotion level  PARTICIPATION LIMITATIONS: meal prep, cleaning, laundry, shopping, community activity, occupation, and yard work  PERSONAL FACTORS: 3+ comorbidities: Lt hip OA, HTN, DM  are also affecting patient's functional outcome.   REHAB POTENTIAL: Good  CLINICAL DECISION MAKING: Evolving/moderate complexity  EVALUATION COMPLEXITY: Moderate   GOALS: Goals reviewed with patient? Yes  SHORT TERM GOALS: Target date: 06/24/2023  Independent with initial HEP Goal status: INITIAL  LONG TERM GOALS: Target date: 07/15/2023  Independent with final HEP Goal status: INITIAL  2.  FOTO score improved to 74 Goal status: INITIAL  3.  Bil hip strength improved to 5/5 for improve function Goal status: INIITAL  4.  Report 50% improvement in symptoms for improved function  Goal status: INITIAL    PLAN:  PT FREQUENCY: 1x/week  PT DURATION: 6 weeks  PLANNED INTERVENTIONS: Therapeutic exercises, Therapeutic activity, Neuromuscular re-education, Balance training, Gait training, Patient/Family education, Self Care, Joint mobilization, Stair training, Aquatic Therapy, Dry Needling, Electrical stimulation, Spinal manipulation, Spinal mobilization, Cryotherapy, Moist heat, Taping, Manual therapy, and Re-evaluation.  PLAN FOR NEXT SESSION: review HEP PRN, modify gym program if needed, may be ready for d/c   NEXT MD VISIT: 07/15/23   Clarita Crane, PT, DPT 07/24/23 11:10 AM     PHYSICAL THERAPY DISCHARGE SUMMARY  Visits from Start of Care: 1  Current functional level related to goals / functional outcomes: See above   Remaining deficits: See above   Education / Equipment: HEP   Patient agrees to discharge. Patient goals were not  met. Patient is being discharged due to not returning since the last visit.  Clarita Crane, PT, DPT 07/24/23 11:10 AM  [facility] [address] [place], [place], [address] Phone: [phone]   Fax:  [phone]

## 2023-06-04 ENCOUNTER — Encounter (HOSPITAL_COMMUNITY): Payer: Self-pay

## 2023-06-04 ENCOUNTER — Encounter (HOSPITAL_COMMUNITY)
Admission: RE | Admit: 2023-06-04 | Discharge: 2023-06-04 | Disposition: A | Discharge status: Home / Self Care | Payer: Medicare HMO | Source: Ambulatory Visit | Attending: Cardiovascular Disease | Admitting: Cardiovascular Disease

## 2023-06-04 ENCOUNTER — Ambulatory Visit (HOSPITAL_COMMUNITY): Payer: Medicare HMO | Attending: Cardiology

## 2023-06-04 DIAGNOSIS — R0602 Shortness of breath: Secondary | ICD-10-CM | POA: Diagnosis not present

## 2023-06-04 DIAGNOSIS — I2510 Atherosclerotic heart disease of native coronary artery without angina pectoris: Secondary | ICD-10-CM | POA: Insufficient documentation

## 2023-06-04 DIAGNOSIS — I77810 Thoracic aortic ectasia: Secondary | ICD-10-CM

## 2023-06-04 LAB — ECHOCARDIOGRAM COMPLETE
Area-P 1/2: 2.87 cm2
S' Lateral: 2.5 cm

## 2023-06-04 MED ORDER — REGADENOSON 0.4 MG/5ML IV SOLN: 0.4000 mg | Freq: Once | INTRAVENOUS | Status: DC

## 2023-06-04 MED ORDER — REGADENOSON 0.4 MG/5ML IV SOLN
INTRAVENOUS | Status: AC
  Filled 2023-06-04: qty 5

## 2023-06-04 NOTE — Progress Notes (Signed)
Called Dr Jerene Pitch and Dr Flora Lipps r/t heart rate dropping to 40 with pauses.

## 2023-06-06 ENCOUNTER — Ambulatory Visit: Payer: Medicare HMO | Admitting: Podiatry

## 2023-06-06 ENCOUNTER — Encounter: Payer: Self-pay | Admitting: Podiatry

## 2023-06-06 DIAGNOSIS — B351 Tinea unguium: Secondary | ICD-10-CM

## 2023-06-06 DIAGNOSIS — M79676 Pain in unspecified toe(s): Secondary | ICD-10-CM

## 2023-06-09 NOTE — Progress Notes (Signed)
Subjective:  Patient ID: FORMAN PAREKH, male    DOB: Jan 31, 1951,  MRN: [id number] HPI Chief Complaint  Patient presents with   Nail Problem    Hallux nails bilateral - thick, bumpy in the middle, don't grow longer, sometimes aggravating when walking for exercising, tried larger shoes but doesn't seem to help, wonders if he can come in every so often to have them trimmed and straightened up   New Patient (Initial Visit)    Est pt 11/2019    72 y.o. male presents with the above complaint.   ROS: Denies fever chills nausea vomit muscle aches pains calf pain back pain chest pain shortness of breath.  Past Medical History:  Diagnosis Date   Allergy    Cancer (HCC) 2010   kidney   Cataract    removed bilater   Chronic kidney disease    kidney disease   Diabetes mellitus without complication (HCC)    prediabetes   GERD (gastroesophageal reflux disease)    Hyperlipidemia    out of balance per pt- good choles low    Hypertension    Past Surgical History:  Procedure Laterality Date   ANAL FISSURE REPAIR     CATARACT EXTRACTION Bilateral 2012   COLONOSCOPY     CYSTOSCOPY WITH INSERTION OF UROLIFT     in office    NEPHRECTOMY Left 2010   kidney cancer   POLYPECTOMY      Current Outpatient Medications:    alfuzosin (UROXATRAL) 10 MG 24 hr tablet, Take 10 mg by mouth daily., Disp: , Rfl:    allopurinol (ZYLOPRIM) 100 MG tablet, Take 1 tablet by mouth daily., Disp: , Rfl:    amLODipine (NORVASC) 10 MG tablet, Take 10 mg by mouth daily., Disp: , Rfl: 3   aspirin EC 81 MG tablet, Take 81 mg by mouth daily., Disp: , Rfl:    Azelastine HCl 137 MCG/SPRAY SOLN, USE TWO SPRAYS IN EACH NOSTRIL AS NEEDED, Disp: , Rfl:    cetirizine (ZYRTEC) 10 MG tablet, Take 10 mg by mouth daily., Disp: , Rfl:    Cholecalciferol (VITAMIN D) 2000 UNITS tablet, Take 2,000 Units by mouth daily., Disp: , Rfl:    Coenzyme Q10 100 MG TABS, Take by mouth daily., Disp: , Rfl:    docusate sodium (COLACE) 100 MG  capsule, Take 100 mg by mouth 2 (two) times daily., Disp: , Rfl:    famotidine (PEPCID) 20 MG tablet, Take 1 tablet by mouth daily., Disp: , Rfl:    fluticasone (FLONASE) 50 MCG/ACT nasal spray, Place 1 spray into both nostrils daily. , Disp: , Rfl:    furosemide (LASIX) 20 MG tablet, Take 20 mg by mouth daily., Disp: , Rfl:    metFORMIN (GLUCOPHAGE) 500 MG tablet, Take 500 mg by mouth 2 (two) times daily., Disp: , Rfl:    Multiple Vitamin (MULTIVITAMIN WITH MINERALS) TABS tablet, Take 1 tablet by mouth daily., Disp: , Rfl:    olmesartan (BENICAR) 40 MG tablet, Take 40 mg by mouth daily., Disp: , Rfl:    omega-3 acid ethyl esters (LOVAZA) 1 g capsule, Take by mouth 2 (two) times daily., Disp: , Rfl:    rosuvastatin (CRESTOR) 20 MG tablet, TAKE 1 TABLET BY MOUTH EVERY DAY, Disp: 90 tablet, Rfl: 2  Current Facility-Administered Medications:    0.9 %  sodium chloride infusion, 500 mL, Intravenous, Once, Nandigam, Kavitha V, MD  Facility-Administered Medications Ordered in Other Visits:    regadenoson (LEXISCAN) injection SOLN 0.4 mg, 0.4  mg, Intravenous, Once, Little Ishikawa, MD  No Known Allergies Review of Systems Objective:  There were no vitals filed for this visit.  General: Well developed, nourished, in no acute distress, alert and oriented x3   Dermatological: Skin is warm, dry and supple bilateral. Nails x 10 are well maintained; remaining integument appears unremarkable at this time. There are no open sores, no preulcerative lesions, no rash or signs of infection present.  Hallux nails bilateral are thick or dystrophic and curved there is some subungual debris.  Vascular: Dorsalis Pedis artery and Posterior Tibial artery pedal pulses are 2/4 bilateral with immedate capillary fill time. Pedal hair growth present. No varicosities and no lower extremity edema present bilateral.   Neruologic: Grossly intact via light touch bilateral. Vibratory intact via tuning fork bilateral.  Protective threshold with Semmes Wienstein monofilament intact to all pedal sites bilateral. Patellar and Achilles deep tendon reflexes 2+ bilateral. No Babinski or clonus noted bilateral.   Musculoskeletal: No gross boney pedal deformities bilateral. No pain, crepitus, or limitation noted with foot and ankle range of motion bilateral. Muscular strength 5/5 in all groups tested bilateral.  Gait: Unassisted, Nonantalgic.    Radiographs:  None taken  Assessment & Plan:   Assessment: Nail dystrophy hallux bilateral  Plan: Nail debridement     Janalee Grobe T. [place], [place]

## 2023-06-10 ENCOUNTER — Encounter: Payer: Medicare HMO | Admitting: Physical Therapy

## 2023-06-10 ENCOUNTER — Telehealth (HOSPITAL_COMMUNITY): Payer: Self-pay | Admitting: *Deleted

## 2023-06-10 NOTE — Telephone Encounter (Signed)
Attempted to call patient regarding upcoming cardiac PET appointment. Left message on voicemail with name and callback number  Larey Brick RN Navigator Cardiac Imaging [facility] [phone] Office [phone] Cell

## 2023-06-11 ENCOUNTER — Ambulatory Visit (HOSPITAL_COMMUNITY)
Admission: RE | Admit: 2023-06-11 | Discharge: 2023-06-11 | Disposition: A | Discharge status: Home / Self Care | Payer: Medicare HMO | Source: Ambulatory Visit | Attending: Cardiovascular Disease | Admitting: Cardiovascular Disease

## 2023-06-11 DIAGNOSIS — I10 Essential (primary) hypertension: Secondary | ICD-10-CM | POA: Diagnosis not present

## 2023-06-11 DIAGNOSIS — R0602 Shortness of breath: Secondary | ICD-10-CM

## 2023-06-11 DIAGNOSIS — I2510 Atherosclerotic heart disease of native coronary artery without angina pectoris: Secondary | ICD-10-CM | POA: Insufficient documentation

## 2023-06-11 LAB — NM PET CT CARDIAC PERFUSION MULTI W/ABSOLUTE BLOODFLOW
LV dias vol: 101 mL (ref 62–150)
MBFR: 3.16
Nuc Rest EF: 51 %
Nuc Stress EF: 58 %
Peak HR: 75 {beats}/min
Rest HR: 45 {beats}/min
Rest MBF: 0.64 ml/g/min
Rest Nuclear Isotope Dose: 21.7 mCi
Rest perfusion cavity size (mL): 101 mL
ST Depression (mm): 0 mm
Stress MBF: 2.02 ml/g/min
Stress Nuclear Isotope Dose: 21.7 mCi
Stress perfusion cavity size (mL): 106 mL
TID: 1.03

## 2023-06-11 MED ORDER — REGADENOSON 0.4 MG/5ML IV SOLN
INTRAVENOUS | Status: AC
  Filled 2023-06-11: qty 5

## 2023-06-11 MED ORDER — REGADENOSON 0.4 MG/5ML IV SOLN
0.4000 mg | Freq: Once | INTRAVENOUS | Status: AC
  Administered 2023-06-11: 0.4 mg via INTRAVENOUS

## 2023-06-11 MED ORDER — RUBIDIUM RB82 GENERATOR (RUBYFILL)
21.7000 | PACK | Freq: Once | INTRAVENOUS | Status: AC
  Administered 2023-06-11: 21.7 via INTRAVENOUS

## 2023-06-12 ENCOUNTER — Encounter: Payer: Self-pay | Admitting: Cardiovascular Disease

## 2023-06-12 DIAGNOSIS — N401 Enlarged prostate with lower urinary tract symptoms: Secondary | ICD-10-CM | POA: Diagnosis not present

## 2023-06-12 DIAGNOSIS — R351 Nocturia: Secondary | ICD-10-CM | POA: Diagnosis not present

## 2023-06-19 ENCOUNTER — Encounter: Payer: Medicare HMO | Admitting: Physical Therapy

## 2023-07-03 DIAGNOSIS — L570 Actinic keratosis: Secondary | ICD-10-CM | POA: Diagnosis not present

## 2023-07-03 DIAGNOSIS — D225 Melanocytic nevi of trunk: Secondary | ICD-10-CM | POA: Diagnosis not present

## 2023-07-03 DIAGNOSIS — D485 Neoplasm of uncertain behavior of skin: Secondary | ICD-10-CM | POA: Diagnosis not present

## 2023-07-04 DIAGNOSIS — R69 Illness, unspecified: Secondary | ICD-10-CM | POA: Diagnosis not present

## 2023-07-15 ENCOUNTER — Ambulatory Visit: Payer: Medicare HMO | Admitting: Orthopaedic Surgery

## 2023-07-22 ENCOUNTER — Encounter: Payer: Self-pay | Admitting: Cardiovascular Disease

## 2023-08-01 DIAGNOSIS — N1831 Chronic kidney disease, stage 3a: Secondary | ICD-10-CM | POA: Diagnosis not present

## 2023-08-08 DIAGNOSIS — I129 Hypertensive chronic kidney disease with stage 1 through stage 4 chronic kidney disease, or unspecified chronic kidney disease: Secondary | ICD-10-CM | POA: Diagnosis not present

## 2023-08-08 DIAGNOSIS — E785 Hyperlipidemia, unspecified: Secondary | ICD-10-CM | POA: Diagnosis not present

## 2023-08-08 DIAGNOSIS — D631 Anemia in chronic kidney disease: Secondary | ICD-10-CM | POA: Diagnosis not present

## 2023-08-08 DIAGNOSIS — C649 Malignant neoplasm of unspecified kidney, except renal pelvis: Secondary | ICD-10-CM | POA: Diagnosis not present

## 2023-08-08 DIAGNOSIS — E1165 Type 2 diabetes mellitus with hyperglycemia: Secondary | ICD-10-CM | POA: Diagnosis not present

## 2023-08-08 DIAGNOSIS — N1831 Chronic kidney disease, stage 3a: Secondary | ICD-10-CM | POA: Diagnosis not present

## 2023-12-03 DIAGNOSIS — M1A9XX0 Chronic gout, unspecified, without tophus (tophi): Secondary | ICD-10-CM | POA: Diagnosis not present

## 2023-12-03 DIAGNOSIS — N400 Enlarged prostate without lower urinary tract symptoms: Secondary | ICD-10-CM | POA: Diagnosis not present

## 2023-12-03 DIAGNOSIS — N1831 Chronic kidney disease, stage 3a: Secondary | ICD-10-CM | POA: Diagnosis not present

## 2023-12-03 DIAGNOSIS — R931 Abnormal findings on diagnostic imaging of heart and coronary circulation: Secondary | ICD-10-CM | POA: Diagnosis not present

## 2023-12-03 DIAGNOSIS — I10 Essential (primary) hypertension: Secondary | ICD-10-CM | POA: Diagnosis not present

## 2023-12-03 DIAGNOSIS — E785 Hyperlipidemia, unspecified: Secondary | ICD-10-CM | POA: Diagnosis not present

## 2023-12-03 DIAGNOSIS — Z1331 Encounter for screening for depression: Secondary | ICD-10-CM | POA: Diagnosis not present

## 2023-12-03 DIAGNOSIS — E1122 Type 2 diabetes mellitus with diabetic chronic kidney disease: Secondary | ICD-10-CM | POA: Diagnosis not present

## 2023-12-03 DIAGNOSIS — M542 Cervicalgia: Secondary | ICD-10-CM | POA: Diagnosis not present

## 2023-12-03 DIAGNOSIS — M5416 Radiculopathy, lumbar region: Secondary | ICD-10-CM | POA: Diagnosis not present

## 2023-12-03 DIAGNOSIS — Z125 Encounter for screening for malignant neoplasm of prostate: Secondary | ICD-10-CM | POA: Diagnosis not present

## 2023-12-03 DIAGNOSIS — Z0000 Encounter for general adult medical examination without abnormal findings: Secondary | ICD-10-CM | POA: Diagnosis not present

## 2024-01-29 DIAGNOSIS — N1831 Chronic kidney disease, stage 3a: Secondary | ICD-10-CM | POA: Diagnosis not present

## 2024-02-06 DIAGNOSIS — H903 Sensorineural hearing loss, bilateral: Secondary | ICD-10-CM | POA: Diagnosis not present

## 2024-02-12 DIAGNOSIS — I129 Hypertensive chronic kidney disease with stage 1 through stage 4 chronic kidney disease, or unspecified chronic kidney disease: Secondary | ICD-10-CM | POA: Diagnosis not present

## 2024-02-12 DIAGNOSIS — N1831 Chronic kidney disease, stage 3a: Secondary | ICD-10-CM | POA: Diagnosis not present

## 2024-02-12 DIAGNOSIS — E785 Hyperlipidemia, unspecified: Secondary | ICD-10-CM | POA: Diagnosis not present

## 2024-02-12 DIAGNOSIS — C649 Malignant neoplasm of unspecified kidney, except renal pelvis: Secondary | ICD-10-CM | POA: Diagnosis not present

## 2024-02-12 DIAGNOSIS — D631 Anemia in chronic kidney disease: Secondary | ICD-10-CM | POA: Diagnosis not present

## 2024-02-12 DIAGNOSIS — E1165 Type 2 diabetes mellitus with hyperglycemia: Secondary | ICD-10-CM | POA: Diagnosis not present

## 2024-02-21 DIAGNOSIS — Z23 Encounter for immunization: Secondary | ICD-10-CM | POA: Diagnosis not present

## 2024-03-09 DIAGNOSIS — D3132 Benign neoplasm of left choroid: Secondary | ICD-10-CM | POA: Diagnosis not present

## 2024-03-09 DIAGNOSIS — H401134 Primary open-angle glaucoma, bilateral, indeterminate stage: Secondary | ICD-10-CM | POA: Diagnosis not present

## 2024-03-09 DIAGNOSIS — E119 Type 2 diabetes mellitus without complications: Secondary | ICD-10-CM | POA: Diagnosis not present

## 2024-03-10 DIAGNOSIS — N1831 Chronic kidney disease, stage 3a: Secondary | ICD-10-CM | POA: Diagnosis not present

## 2024-03-23 DIAGNOSIS — H401134 Primary open-angle glaucoma, bilateral, indeterminate stage: Secondary | ICD-10-CM | POA: Diagnosis not present

## 2024-04-08 DIAGNOSIS — E1165 Type 2 diabetes mellitus with hyperglycemia: Secondary | ICD-10-CM | POA: Diagnosis not present

## 2024-04-08 DIAGNOSIS — N1831 Chronic kidney disease, stage 3a: Secondary | ICD-10-CM | POA: Diagnosis not present

## 2024-06-04 DIAGNOSIS — I10 Essential (primary) hypertension: Secondary | ICD-10-CM | POA: Diagnosis not present

## 2024-06-04 DIAGNOSIS — K5909 Other constipation: Secondary | ICD-10-CM | POA: Diagnosis not present

## 2024-06-04 DIAGNOSIS — E785 Hyperlipidemia, unspecified: Secondary | ICD-10-CM | POA: Diagnosis not present

## 2024-06-04 DIAGNOSIS — N1831 Chronic kidney disease, stage 3a: Secondary | ICD-10-CM | POA: Diagnosis not present

## 2024-06-04 DIAGNOSIS — Z23 Encounter for immunization: Secondary | ICD-10-CM | POA: Diagnosis not present

## 2024-06-04 DIAGNOSIS — E1122 Type 2 diabetes mellitus with diabetic chronic kidney disease: Secondary | ICD-10-CM | POA: Diagnosis not present

## 2024-07-21 DIAGNOSIS — N1831 Chronic kidney disease, stage 3a: Secondary | ICD-10-CM | POA: Diagnosis not present

## 2024-08-04 DIAGNOSIS — N1831 Chronic kidney disease, stage 3a: Secondary | ICD-10-CM | POA: Diagnosis not present

## 2024-08-04 DIAGNOSIS — N189 Chronic kidney disease, unspecified: Secondary | ICD-10-CM | POA: Diagnosis not present

## 2024-08-04 DIAGNOSIS — E1165 Type 2 diabetes mellitus with hyperglycemia: Secondary | ICD-10-CM | POA: Diagnosis not present

## 2024-08-04 DIAGNOSIS — C649 Malignant neoplasm of unspecified kidney, except renal pelvis: Secondary | ICD-10-CM | POA: Diagnosis not present

## 2024-08-04 DIAGNOSIS — E785 Hyperlipidemia, unspecified: Secondary | ICD-10-CM | POA: Diagnosis not present

## 2024-08-04 DIAGNOSIS — I129 Hypertensive chronic kidney disease with stage 1 through stage 4 chronic kidney disease, or unspecified chronic kidney disease: Secondary | ICD-10-CM | POA: Diagnosis not present

## 2024-08-04 DIAGNOSIS — D631 Anemia in chronic kidney disease: Secondary | ICD-10-CM | POA: Diagnosis not present

## 2024-09-03 DIAGNOSIS — N401 Enlarged prostate with lower urinary tract symptoms: Secondary | ICD-10-CM | POA: Diagnosis not present

## 2024-09-03 DIAGNOSIS — R3915 Urgency of urination: Secondary | ICD-10-CM | POA: Diagnosis not present

## 2024-10-08 ENCOUNTER — Ambulatory Visit: Admitting: Podiatry

## 2024-10-15 ENCOUNTER — Encounter: Payer: Self-pay | Admitting: Podiatry

## 2024-10-15 ENCOUNTER — Ambulatory Visit: Admitting: Podiatry

## 2024-10-15 DIAGNOSIS — M79676 Pain in unspecified toe(s): Secondary | ICD-10-CM | POA: Diagnosis not present

## 2024-10-15 DIAGNOSIS — B351 Tinea unguium: Secondary | ICD-10-CM

## 2024-10-19 NOTE — Progress Notes (Signed)
 "  Subjective:  Patient ID: MATTHEWJAMES PETRASEK, male    DOB: 05/09/1951,  MRN: [id number] HPI Chief Complaint  Patient presents with   Debridement    Trim toenails    74 y.o. male presents with the above complaint.   ROS: Denies fever chills nausea vomit muscle aches pains calf pain back pain chest pain shortness of breath.  Past Medical History:  Diagnosis Date   Allergy    Cancer (HCC) 2010   kidney   Cataract    removed bilater   Chronic kidney disease    kidney disease   Diabetes mellitus without complication (HCC)    prediabetes   GERD (gastroesophageal reflux disease)    Hyperlipidemia    out of balance per pt- good choles low    Hypertension    Past Surgical History:  Procedure Laterality Date   ANAL FISSURE REPAIR     CATARACT EXTRACTION Bilateral 2012   COLONOSCOPY     CYSTOSCOPY WITH INSERTION OF UROLIFT     in office    NEPHRECTOMY Left 2010   kidney cancer   POLYPECTOMY      Current Outpatient Medications:    alfuzosin (UROXATRAL) 10 MG 24 hr tablet, Take 10 mg by mouth daily., Disp: , Rfl:    allopurinol (ZYLOPRIM) 100 MG tablet, Take 1 tablet by mouth daily., Disp: , Rfl:    amLODipine (NORVASC) 10 MG tablet, Take 10 mg by mouth daily., Disp: , Rfl: 3   aspirin EC 81 MG tablet, Take 81 mg by mouth daily., Disp: , Rfl:    Azelastine HCl 137 MCG/SPRAY SOLN, USE TWO SPRAYS IN EACH NOSTRIL AS NEEDED, Disp: , Rfl:    cetirizine (ZYRTEC) 10 MG tablet, Take 10 mg by mouth daily., Disp: , Rfl:    Cholecalciferol (VITAMIN D) 2000 UNITS tablet, Take 2,000 Units by mouth daily., Disp: , Rfl:    Coenzyme Q10 100 MG TABS, Take by mouth daily., Disp: , Rfl:    docusate sodium (COLACE) 100 MG capsule, Take 100 mg by mouth 2 (two) times daily., Disp: , Rfl:    famotidine (PEPCID) 20 MG tablet, Take 1 tablet by mouth daily., Disp: , Rfl:    fluticasone (FLONASE) 50 MCG/ACT nasal spray, Place 1 spray into both nostrils daily. , Disp: , Rfl:    furosemide (LASIX) 20 MG  tablet, Take 20 mg by mouth daily., Disp: , Rfl:    metFORMIN (GLUCOPHAGE) 500 MG tablet, Take 500 mg by mouth 2 (two) times daily., Disp: , Rfl:    Multiple Vitamin (MULTIVITAMIN WITH MINERALS) TABS tablet, Take 1 tablet by mouth daily., Disp: , Rfl:    olmesartan (BENICAR) 40 MG tablet, Take 40 mg by mouth daily., Disp: , Rfl:    omega-3 acid ethyl esters (LOVAZA) 1 g capsule, Take by mouth 2 (two) times daily., Disp: , Rfl:    rosuvastatin  (CRESTOR ) 20 MG tablet, TAKE 1 TABLET BY MOUTH EVERY DAY, Disp: 90 tablet, Rfl: 2  Current Facility-Administered Medications:    0.9 %  sodium chloride  infusion, 500 mL, Intravenous, Once, Nandigam, Kavitha V, MD  No Known Allergies Review of Systems Objective:  There were no vitals filed for this visit.  General: Well developed, nourished, in no acute distress, alert and oriented x3   Dermatological: Skin is warm, dry and supple bilateral. Nails x 10 are well maintained; remaining integument appears unremarkable at this time. There are no open sores, no preulcerative lesions, no rash or signs of infection present.  Hallux nails bilateral are thick or dystrophic and curved there is some subungual debris.  Vascular: Dorsalis Pedis artery and Posterior Tibial artery pedal pulses are 2/4 bilateral with immedate capillary fill time. Pedal hair growth present. No varicosities and no lower extremity edema present bilateral.   Neruologic: Grossly intact via light touch bilateral. Vibratory intact via tuning fork bilateral. Protective threshold with Semmes Wienstein monofilament intact to all pedal sites bilateral. Patellar and Achilles deep tendon reflexes 2+ bilateral. No Babinski or clonus noted bilateral.   Musculoskeletal: No gross boney pedal deformities bilateral. No pain, crepitus, or limitation noted with foot and ankle range of motion bilateral. Muscular strength 5/5 in all groups tested bilateral.  Gait: Unassisted, Nonantalgic.     Radiographs:  None taken  Assessment & Plan:   Assessment: Nail dystrophy hallux bilateral  Plan: Nail debridement     Lashawna Poche T. [place], DPM "
